# Patient Record
Sex: Female | Born: 1990 | Race: Black or African American | Hispanic: No | Marital: Single | Smoking: Former smoker
Health system: Southern US, Community
[De-identification: ages and names within clinical notes are randomized; demographics above are authoritative.]

## PROBLEM LIST (undated history)

## (undated) DIAGNOSIS — D219 Benign neoplasm of connective and other soft tissue, unspecified: Secondary | ICD-10-CM

## (undated) DIAGNOSIS — J45909 Unspecified asthma, uncomplicated: Secondary | ICD-10-CM

## (undated) DIAGNOSIS — F329 Major depressive disorder, single episode, unspecified: Secondary | ICD-10-CM

## (undated) DIAGNOSIS — F419 Anxiety disorder, unspecified: Secondary | ICD-10-CM

## (undated) DIAGNOSIS — F32A Depression, unspecified: Secondary | ICD-10-CM

## (undated) DIAGNOSIS — D649 Anemia, unspecified: Secondary | ICD-10-CM

## (undated) DIAGNOSIS — O24419 Gestational diabetes mellitus in pregnancy, unspecified control: Secondary | ICD-10-CM

## (undated) HISTORY — PX: CARPAL TUNNEL RELEASE: SHX101

---
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
## 2014-08-16 ENCOUNTER — Emergency Department (HOSPITAL_COMMUNITY): Payer: Self-pay

## 2014-08-16 ENCOUNTER — Emergency Department (HOSPITAL_COMMUNITY)
Admission: EM | Admit: 2014-08-16 | Discharge: 2014-08-16 | Disposition: A | Discharge status: Home / Self Care | Payer: Self-pay | Attending: Emergency Medicine | Admitting: Emergency Medicine

## 2014-08-16 ENCOUNTER — Encounter (HOSPITAL_COMMUNITY): Payer: Self-pay | Admitting: Emergency Medicine

## 2014-08-16 DIAGNOSIS — R000 Tachycardia, unspecified: Secondary | ICD-10-CM | POA: Insufficient documentation

## 2014-08-16 DIAGNOSIS — Z79899 Other long term (current) drug therapy: Secondary | ICD-10-CM | POA: Insufficient documentation

## 2014-08-16 DIAGNOSIS — J45909 Unspecified asthma, uncomplicated: Secondary | ICD-10-CM | POA: Insufficient documentation

## 2014-08-16 DIAGNOSIS — S46909A Unspecified injury of unspecified muscle, fascia and tendon at shoulder and upper arm level, unspecified arm, initial encounter: Secondary | ICD-10-CM | POA: Insufficient documentation

## 2014-08-16 DIAGNOSIS — S4980XA Other specified injuries of shoulder and upper arm, unspecified arm, initial encounter: Secondary | ICD-10-CM | POA: Insufficient documentation

## 2014-08-16 DIAGNOSIS — S4991XA Unspecified injury of right shoulder and upper arm, initial encounter: Secondary | ICD-10-CM

## 2014-08-16 DIAGNOSIS — S299XXA Unspecified injury of thorax, initial encounter: Secondary | ICD-10-CM

## 2014-08-16 DIAGNOSIS — S298XXA Other specified injuries of thorax, initial encounter: Secondary | ICD-10-CM | POA: Insufficient documentation

## 2014-08-16 HISTORY — DX: Unspecified asthma, uncomplicated: J45.909

## 2014-08-16 MED ORDER — HYDROCODONE-ACETAMINOPHEN 5-325 MG PO TABS: 1.0000 | ORAL_TABLET | Freq: Four times a day (QID) | ORAL | Status: DC | PRN

## 2014-08-16 MED ORDER — MORPHINE SULFATE 4 MG/ML IJ SOLN
4.0000 mg | Freq: Once | INTRAMUSCULAR | Status: AC
  Administered 2014-08-16: 4 mg via INTRAVENOUS
  Filled 2014-08-16: qty 1
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
## 2014-12-12 ENCOUNTER — Encounter (HOSPITAL_COMMUNITY): Payer: Self-pay | Admitting: *Deleted

## 2014-12-12 ENCOUNTER — Emergency Department (HOSPITAL_COMMUNITY)
Admission: EM | Admit: 2014-12-12 | Discharge: 2014-12-12 | Discharge status: Against Medical Advice | Payer: Medicaid Other | Attending: Emergency Medicine | Admitting: Emergency Medicine

## 2014-12-12 DIAGNOSIS — J45909 Unspecified asthma, uncomplicated: Secondary | ICD-10-CM | POA: Insufficient documentation

## 2014-12-12 DIAGNOSIS — Z720 Tobacco use: Secondary | ICD-10-CM | POA: Diagnosis not present

## 2014-12-12 DIAGNOSIS — R55 Syncope and collapse: Secondary | ICD-10-CM | POA: Insufficient documentation

## 2014-12-12 LAB — BASIC METABOLIC PANEL
ANION GAP: 8 (ref 5–15)
BUN: 12 mg/dL (ref 6–23)
CHLORIDE: 109 mmol/L (ref 96–112)
CO2: 20 mmol/L (ref 19–32)
CREATININE: 0.85 mg/dL (ref 0.50–1.10)
Calcium: 8.6 mg/dL (ref 8.4–10.5)
GFR calc Af Amer: >90 mL/min (ref >90)
GFR calc non Af Amer: >90 mL/min (ref >90)
GLUCOSE: 96 mg/dL (ref 70–99)
Potassium: 4 mmol/L (ref 3.5–5.1)
SODIUM: 137 mmol/L (ref 135–145)

## 2014-12-12 LAB — CBC
HEMATOCRIT: 42 % (ref 36.0–46.0)
Hemoglobin: 13.5 g/dL (ref 12.0–15.0)
MCH: 26.3 pg (ref 26.0–34.0)
MCHC: 32.1 g/dL (ref 30.0–36.0)
MCV: 81.9 fL (ref 78.0–100.0)
Platelets: 141 10*3/uL — ABNORMAL LOW (ref 150–400)
RBC: 5.13 MIL/uL — ABNORMAL HIGH (ref 3.87–5.11)
RDW: 13.4 % (ref 11.5–15.5)
WBC: 6 10*3/uL (ref 4.0–10.5)

## 2014-12-12 NOTE — ED Notes (Signed)
Pt refusing to sign AMA form. Pt ambulatory from ED with swift and steady gait. Mother requesting directors name and number as well as writers name. Information provided.

## 2014-12-12 NOTE — ED Notes (Signed)
Attempted to take pt to room, per mother pt no longer wants to be seen. Requesting to leave AMA

## 2014-12-12 NOTE — ED Notes (Signed)
Bed: [facility] Expected date:  Expected time:  Means of arrival:  Comments: EMS near syncope after giving platelets this am

## 2014-12-12 NOTE — ED Notes (Signed)
Pt reports giving plasma earlier today around 1600.  Mom reports syncope about 45 minutes ago.  Reports pt was out for about 2 minutes.  Reports pt was sitting down on the computer -suddenly became hot.  So she went out side to cool off, and when she went back in, she had a syncopal episode.  Pt does not recall feeling dizzy prior to syncopal episode.  Pt reports dizziness at present.  Denies any cp or SOB at this time.

## 2014-12-12 NOTE — ED Notes (Addendum)
Pt transported from home after near syncopal episode witnessed by father. Pt was on the ground when near syncope occurred, she was hyperventilating per EMS. Pt did donate plasma this morning. Pt states she has had this happen in the past after donating plasma. A & O. Pt was 70/40 initial contact. IV est, NS 250 bolus given. Per Mother in room pt does not want to be assessed by RN, PA or NP, only wants to see real doctor.

## 2015-01-03 ENCOUNTER — Encounter (HOSPITAL_COMMUNITY): Payer: Self-pay | Admitting: *Deleted

## 2015-01-03 ENCOUNTER — Ambulatory Visit (HOSPITAL_COMMUNITY)
Admission: RE | Admit: 2015-01-03 | Discharge: 2015-01-03 | Disposition: A | Discharge status: Home / Self Care | Payer: Medicaid Other | Attending: Psychiatry | Admitting: Psychiatry

## 2015-01-03 DIAGNOSIS — F329 Major depressive disorder, single episode, unspecified: Secondary | ICD-10-CM | POA: Diagnosis present

## 2015-01-03 DIAGNOSIS — F419 Anxiety disorder, unspecified: Secondary | ICD-10-CM | POA: Diagnosis not present
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
## 2015-09-04 ENCOUNTER — Emergency Department (HOSPITAL_COMMUNITY): Payer: Medicaid Other

## 2015-09-04 ENCOUNTER — Encounter (HOSPITAL_COMMUNITY): Payer: Self-pay | Admitting: Emergency Medicine

## 2015-09-04 ENCOUNTER — Emergency Department (HOSPITAL_COMMUNITY)
Admission: EM | Admit: 2015-09-04 | Discharge: 2015-09-04 | Disposition: A | Discharge status: Home / Self Care | Payer: Medicaid Other | Attending: Emergency Medicine | Admitting: Emergency Medicine

## 2015-09-04 DIAGNOSIS — J159 Unspecified bacterial pneumonia: Secondary | ICD-10-CM | POA: Insufficient documentation

## 2015-09-04 DIAGNOSIS — R05 Cough: Secondary | ICD-10-CM | POA: Diagnosis present

## 2015-09-04 DIAGNOSIS — J189 Pneumonia, unspecified organism: Secondary | ICD-10-CM

## 2015-09-04 DIAGNOSIS — Z3202 Encounter for pregnancy test, result negative: Secondary | ICD-10-CM | POA: Insufficient documentation

## 2015-09-04 DIAGNOSIS — J45909 Unspecified asthma, uncomplicated: Secondary | ICD-10-CM | POA: Insufficient documentation

## 2015-09-04 DIAGNOSIS — Z79899 Other long term (current) drug therapy: Secondary | ICD-10-CM | POA: Diagnosis not present

## 2015-09-04 DIAGNOSIS — R079 Chest pain, unspecified: Secondary | ICD-10-CM

## 2015-09-04 DIAGNOSIS — Z720 Tobacco use: Secondary | ICD-10-CM | POA: Diagnosis not present

## 2015-09-04 LAB — CBC WITH DIFFERENTIAL/PLATELET
Basophils Absolute: 0 10*3/uL (ref 0.0–0.1)
Basophils Relative: 0 %
EOS PCT: 5 %
Eosinophils Absolute: 0.3 10*3/uL (ref 0.0–0.7)
HCT: 38.9 % (ref 36.0–46.0)
Hemoglobin: 12.6 g/dL (ref 12.0–15.0)
LYMPHS ABS: 1.7 10*3/uL (ref 0.7–4.0)
LYMPHS PCT: 31 %
MCH: 26.4 pg (ref 26.0–34.0)
MCHC: 32.4 g/dL (ref 30.0–36.0)
MCV: 81.4 fL (ref 78.0–100.0)
MONO ABS: 0.8 10*3/uL (ref 0.1–1.0)
Monocytes Relative: 15 %
Neutro Abs: 2.6 10*3/uL (ref 1.7–7.7)
Neutrophils Relative %: 49 %
PLATELETS: 157 10*3/uL (ref 150–400)
RBC: 4.78 MIL/uL (ref 3.87–5.11)
RDW: 13.3 % (ref 11.5–15.5)
WBC: 5.4 10*3/uL (ref 4.0–10.5)

## 2015-09-04 LAB — BASIC METABOLIC PANEL
Anion gap: 5 (ref 5–15)
BUN: 6 mg/dL (ref 6–20)
CHLORIDE: 108 mmol/L (ref 101–111)
CO2: 23 mmol/L (ref 22–32)
Calcium: 8.5 mg/dL — ABNORMAL LOW (ref 8.9–10.3)
Creatinine, Ser: 0.71 mg/dL (ref 0.44–1.00)
GFR calc Af Amer: >60 mL/min (ref >60)
GFR calc non Af Amer: >60 mL/min (ref >60)
GLUCOSE: 101 mg/dL — AB (ref 65–99)
Potassium: 3.8 mmol/L (ref 3.5–5.1)
Sodium: 136 mmol/L (ref 135–145)

## 2015-09-04 LAB — TROPONIN I

## 2015-09-04 LAB — POC URINE PREG, ED: Preg Test, Ur: NEGATIVE

## 2015-09-04 MED ORDER — AZITHROMYCIN 250 MG PO TABS
500.0000 mg | ORAL_TABLET | Freq: Once | ORAL | Status: AC
Start: 2015-09-04 — End: 2015-09-04
  Administered 2015-09-04: 500 mg via ORAL
  Filled 2015-09-04: qty 2

## 2015-09-04 MED ORDER — AZITHROMYCIN 250 MG PO TABS
250.0000 mg | ORAL_TABLET | Freq: Every day | ORAL | Status: DC
Start: 2015-09-04 — End: 2017-03-27

## 2015-09-04 NOTE — ED Provider Notes (Signed)
CSN: [id number]     Arrival date & time 09/04/15  7829 History   First MD Initiated Contact with Patient 09/04/15 [phone]     Chief Complaint  Patient presents with  . Cough     (Consider location/radiation/quality/duration/timing/severity/associated sxs/prior Treatment) HPI Comments: 24 year old female with history of asthma who presents with cough and chest pain. Patient reports 2 weeks of dry cough. 2 days ago, she began having a constant chest tightness/pressure which is worse with coughing and has been associated with cold sweats. She endorses mild associated shortness of breath particularly when she is having a coughing episode. She denies any nausea, vomiting, diarrhea, or abdominal pain. No fevers. No urinary symptoms. She does endorse a mild sore throat. Her mother was ill recently with a flulike illness. No family history of early heart disease. No personal or family history of blood clots. No recent travel, leg swelling, or OCP use.  Patient is a 24 y.o. female presenting with cough. The history is provided by the patient.  Cough   Past Medical History  Diagnosis Date  . Asthma    Past Surgical History  Procedure Laterality Date  . Cesarean section     No family history on file. Social History  Substance Use Topics  . Smoking status: Current Every Day Smoker -- 0.25 packs/day    Types: Cigarettes  . Smokeless tobacco: Never Used  . Alcohol Use: Yes     Comment: socially    OB History    No data available     Review of Systems  Respiratory: Positive for cough.    10 Systems reviewed and are negative for acute change except as noted in the HPI.    Allergies  Review of patient's allergies indicates no known allergies.  Home Medications   Prior to Admission medications   Medication Sig Start Date End Date Taking? Authorizing Provider  albuterol (PROVENTIL HFA;VENTOLIN HFA) 108 (90 BASE) MCG/ACT inhaler Inhale 2 puffs into the lungs every 6 (six) hours as needed  for wheezing or shortness of breath.   Yes Historical Provider, MD  cetirizine HCl (ZYRTEC) 5 MG/5ML SYRP Take 10 mg by mouth daily as needed for allergies.   Yes Historical Provider, MD  azithromycin (ZITHROMAX Z-PAK) 250 MG tablet Take 1 tablet (250 mg total) by mouth daily. 09/04/15   Wenda Overland Tremayne Sheldon, MD   BP 107/75 mmHg  Pulse 97  Temp(Src) 98.1 F (36.7 C) (Oral)  Resp 18  SpO2 99%  LMP 08/14/2015 Physical Exam  Constitutional: She is oriented to person, place, and time. She appears well-developed and well-nourished. No distress.  HENT:  Head: Normocephalic and atraumatic.  Mouth/Throat: Oropharynx is clear and moist.  Moist mucous membranes  Eyes: Conjunctivae are normal. Pupils are equal, round, and reactive to light.  Neck: Neck supple.  Cardiovascular: Normal rate, regular rhythm and normal heart sounds.   No murmur heard. Pulmonary/Chest: Effort normal and breath sounds normal. No respiratory distress. She has no wheezes. She exhibits no tenderness.  Occasional cough  Abdominal: Soft. Bowel sounds are normal. She exhibits no distension. There is no tenderness.  Musculoskeletal: She exhibits no edema.  Neurological: She is alert and oriented to person, place, and time.  Fluent speech  Skin: Skin is warm and dry.  Psychiatric: She has a normal mood and affect. Judgment normal.  Nursing note and vitals reviewed.   ED Course  Procedures (including critical care time) Labs Review Labs Reviewed  BASIC METABOLIC PANEL - Abnormal; Notable for  the following:    Glucose, Bld 101 (*)    Calcium 8.5 (*)    All other components within normal limits  CBC WITH DIFFERENTIAL/PLATELET  TROPONIN I  I-STAT TROPOININ, ED  POC URINE PREG, ED    Imaging Review Dg Chest 2 View  09/04/2015  CLINICAL DATA:  Dry cough for 1 week EXAM: CHEST  2 VIEW COMPARISON:  08/16/2014 FINDINGS: Right middle lobe and subtle left upper lung opacities. No edema, effusion, or air leak. Normal  heart size and aortic contours. IMPRESSION: Right middle lobe pneumonia assuming appropriate clinical setting. There may also be milder left lung involvement. Electronically Signed   By: Monte Fantasia M.D.   On: 09/04/2015 09:11   I have personally reviewed and evaluated these images and lab results as part of my medical decision-making.   EKG Interpretation   Date/Time:  Tuesday September 04 2015 08:30:57 EDT Ventricular Rate:  98 PR Interval:  131 QRS Duration: 95 QT Interval:  350 QTC Calculation: 447 R Axis:   90 Text Interpretation:  Sinus rhythm Right atrial enlargement Borderline  right axis deviation Nonspecific T abnormalities, anterior leads T wave  inversions in III, V3 new from previous EKG, no reciprocal changes  Confirmed by Luzelena Heeg MD, Ernesteen Mihalic [phone]) on 09/04/2015 10:29:23 AM      Medications  azithromycin (ZITHROMAX) tablet 500 mg (500 mg Oral Given 09/04/15 1056)   Filed Vitals:   09/04/15 0831 09/04/15 1209 09/04/15 1330  BP: 112/75 109/70 107/75  Pulse: 91 82 97  Temp: 98.2 F (36.8 C) 98.1 F (36.7 C)   TempSrc: Oral Oral   Resp: 19 18   SpO2: 98% 98% 99%    MDM   Final diagnoses:  Community acquired pneumonia  Chest pain, unspecified chest pain type   23yo F w/ asthma who p/w 2 weeks of cough and 2 days of chest pain/tightness and mild SOB w/ coughing. Patient nontoxic in appearance with no respiratory distress. O2 sat 98% on room air and normal work of breathing. No wheezing noted on exam. Obtained basic labs which were unremarkable. Chest x-ray showed a right middle lobe and possible left upper lobe pneumonia. Gave the patient 500 mg azithromycin. Troponin was negative and patient is PERC negative thus PE very unlikely. I suspect that chest pain likely related to coughing and underlying community-acquired pneumonia. On reexamination, the patient is well-appearing. She remains comfortable with normal work of breathing and normal O2 saturations. She has no  wheezing on exam and therefore I do not feel that she needs steroids at this time. I have discussed outpatient treatment of pneumonia and reviewed return precautions including worsening symptoms. Patient voiced understanding and was discharged in satisfactory condition.  Sharlett Iles, MD 09/04/15 [phone]

## 2015-09-04 NOTE — Progress Notes (Signed)
Peak flow 200cc times 2.

## 2015-09-04 NOTE — ED Notes (Signed)
Per pt, states cough for 2 weeks-cold sweats and chest pain for 2 days

## 2015-09-04 NOTE — ED Notes (Signed)
resp called to measure peak flow

## 2015-09-04 NOTE — ED Notes (Signed)
Bed: WA20 Expected date:  Expected time:  Means of arrival:  Comments: 

## 2017-03-27 ENCOUNTER — Emergency Department (HOSPITAL_COMMUNITY)
Admission: EM | Admit: 2017-03-27 | Discharge: 2017-03-28 | Disposition: A | Discharge status: Home / Self Care | Payer: Medicaid Other | Attending: Emergency Medicine | Admitting: Emergency Medicine

## 2017-03-27 ENCOUNTER — Encounter (HOSPITAL_COMMUNITY): Payer: Self-pay | Admitting: Emergency Medicine

## 2017-03-27 DIAGNOSIS — F17210 Nicotine dependence, cigarettes, uncomplicated: Secondary | ICD-10-CM | POA: Diagnosis not present

## 2017-03-27 DIAGNOSIS — J45909 Unspecified asthma, uncomplicated: Secondary | ICD-10-CM | POA: Diagnosis not present

## 2017-03-27 DIAGNOSIS — L02411 Cutaneous abscess of right axilla: Secondary | ICD-10-CM

## 2017-03-27 MED ORDER — HYDROCODONE-ACETAMINOPHEN 5-325 MG PO TABS: 1.0000 | ORAL_TABLET | Freq: Four times a day (QID) | ORAL | 0 refills | Status: DC | PRN

## 2017-03-27 MED ORDER — LIDOCAINE-EPINEPHRINE (PF) 2 %-1:200000 IJ SOLN
20.0000 mL | Freq: Once | INTRAMUSCULAR | Status: AC
  Administered 2017-03-27: 20 mL
  Filled 2017-03-27: qty 20

## 2017-03-27 MED ORDER — NAPROXEN 500 MG PO TABS: 500.0000 mg | ORAL_TABLET | Freq: Two times a day (BID) | ORAL | 0 refills | Status: DC | PRN

## 2017-03-27 MED ORDER — HYDROCODONE-ACETAMINOPHEN 5-325 MG PO TABS
1.0000 | ORAL_TABLET | Freq: Once | ORAL | Status: AC
  Administered 2017-03-27: 1 via ORAL
  Filled 2017-03-27: qty 1

## 2017-03-27 MED ORDER — CEPHALEXIN 500 MG PO CAPS: ORAL_CAPSULE | ORAL | 0 refills | Status: DC

## 2017-03-27 NOTE — Discharge Instructions (Signed)
Keep wound clean and dry. Apply warm compresses to affected area throughout the day, about 4 times per day if possible. Take antibiotic until it is finished. Alternate between naprosyn and norco as directed, as needed for pain but do not drive or operate machinery with pain medication use. Followup with [facility]Primary Care doctor in 2 days for wound recheck and then with your regular doctor in 1 week for recheck of symptoms. Monitor area for signs of infection to include, but not limited to: increasing pain, spreading redness, drainage/pus, worsening swelling, or fevers. Return to emergency department for emergent changing or worsening symptoms.

## 2017-03-27 NOTE — ED Notes (Signed)
ED Provider at bedside. 

## 2017-03-27 NOTE — ED Provider Notes (Signed)
[facility] DEPT Provider Note   CSN: [id number] Arrival date & time: 03/27/17  1803  By signing my name below, I, Hansel Feinstein, attest that this documentation has been prepared under the direction and in the presence of  [address] [employer]. Electronically Signed: Hansel Feinstein, ED Scribe. 03/27/17. 11:03 PM.    History   Chief Complaint Chief Complaint  Patient presents with  . Abscess    HPI Danielle Miller is a 26 y.o. female who presents to the Emergency Department complaining of a gradually worsening, painful and warm swollen "boil" to her R axilla that began 2 days ago. Pt describes her pain as "12/10" constant, sharp shooting, R axilla pain radiating to the R lateral chest wall and breast, worsened with movement and unrelieved with Motrin. She has not had the area I&D'ed contrary to what is stated in the nursing note. No known recent injury or skin opening to the area. Pt states she has frequent h/o similar "boils" but they usually don't get this big, and they usually go away on their own; she has since stopped shaving her armpits. Reports low grade temp 100.0 yesterday. Denies redness/red streaking to or drainage from the area, fevers above 100.4, chills, CP, SOB, abd pain, N/V/D/C, hematuria, dysuria, myalgias, arthralgias, numbness, tingling, focal weakness, or any other complaints at this time. No hx of immunosuppressing conditions.   The history is provided by the patient and medical records. No language interpreter was used.  Abscess  Location:  Shoulder/arm Shoulder/arm abscess location:  R axilla Abscess quality: painful and warmth   Abscess quality: not draining and no redness   Red streaking: no   Duration:  2 days Progression:  Worsening Pain details:    Quality:  Sharp and shooting   Severity:  Severe   Duration:  2 days   Timing:  Constant   Progression:  Worsening Chronicity:  Recurrent Context: not diabetes, not immunosuppression, not insect bite/sting and  not skin injury   Relieved by:  Nothing Exacerbated by: movement. Ineffective treatments:  NSAIDs Associated symptoms: fever   Associated symptoms: no nausea and no vomiting   Risk factors: prior abscess     Past Medical History:  Diagnosis Date  . Asthma     There are no active problems to display for this patient.   Past Surgical History:  Procedure Laterality Date  . CESAREAN SECTION      OB History    No data available       Home Medications    Prior to Admission medications   Medication Sig Start Date End Date Taking? Authorizing Provider  ibuprofen (ADVIL,MOTRIN) 200 MG tablet Take 400 mg by mouth every 6 (six) hours as needed for mild pain.   Yes [provider]    Family History History reviewed. No pertinent family history.  Social History Social History  Substance Use Topics  . Smoking status: Current Every Day Smoker    Packs/day: 0.25    Types: Cigarettes  . Smokeless tobacco: Never Used  . Alcohol use Yes     Comment: socially      Allergies   Patient has no known allergies.   Review of Systems Review of Systems  Constitutional: Positive for fever. Negative for chills.  Respiratory: Negative for shortness of breath.   Cardiovascular: Negative for chest pain.  Gastrointestinal: Negative for abdominal pain, constipation, diarrhea, nausea and vomiting.  Genitourinary: Negative for dysuria and hematuria.  Musculoskeletal: Negative for arthralgias and myalgias.  Skin: Positive for  wound (+painful, warm boil to R axilla ). Negative for color change.  Allergic/Immunologic: Negative for immunocompromised state.  Neurological: Negative for weakness and numbness.  Psychiatric/Behavioral: Negative for confusion.   A complete 10 system review of systems was obtained and all systems are negative except as noted in the HPI and PMH.    Physical Exam Updated Vital Signs BP 111/76 (BP Location: Left Arm)   Pulse 72   Temp 99 F (37.2 C)  (Oral)   Resp 18   Ht 5\' 8"  (1.727 m)   Wt 185 lb 1 oz (83.9 kg)   SpO2 100%   BMI 28.14 kg/m   Physical Exam  Constitutional: She is oriented to person, place, and time. Vital signs are normal. She appears well-developed and well-nourished.  Non-toxic appearance. No distress.  Afebrile, nontoxic, NAD  HENT:  Head: Normocephalic and atraumatic.  Mouth/Throat: Mucous membranes are normal.  Eyes: Conjunctivae and EOM are normal. Right eye exhibits no discharge. Left eye exhibits no discharge.  Neck: Normal range of motion. Neck supple.  Cardiovascular: Normal rate and intact distal pulses.   Pulmonary/Chest: Effort normal. No respiratory distress.  Abdominal: Normal appearance. She exhibits no distension.  Musculoskeletal: Normal range of motion.  Neurological: She is alert and oriented to person, place, and time. She has normal strength. No sensory deficit.  Skin: Skin is warm, dry and intact. Lesion (abscess) noted. No rash noted.  Right axilla with ~4 cm x 3 cm fluctuant abscess, mildly warm with no erythema or red streaking. Moderately tender with no surrounding induration or signs of cellulitis. No drainage. Small opening superior to the abscess that does not seems to connect and has no active drainage.   Psychiatric: She has a normal mood and affect. Her behavior is normal.  Nursing note and vitals reviewed.    ED Treatments / Results   DIAGNOSTIC STUDIES: Oxygen Saturation is 100% on RA, normal by my interpretation.    COORDINATION OF CARE: 10:25 PM Discussed treatment plan with pt at bedside which includes I&D, pain medication, Keflex and pt agreed to plan.    Labs (all labs ordered are listed, but only abnormal results are displayed) Labs Reviewed - No data to display  EKG  EKG Interpretation None       Radiology No results found.  Procedures .Marland KitchenIncision and Drainage Date/Time: 03/27/2017 11:24 PM Performed by: Reece Agar Authorized by: Reece Agar   Consent:    Consent obtained:  Verbal   Consent given by:  Patient   Risks discussed:  Bleeding, incomplete drainage, infection and pain   Alternatives discussed:  No treatment Universal protocol:    Procedure explained and questions answered to patient or proxy's satisfaction: yes     Immediately prior to procedure a time out was called: yes     Patient identity confirmed:  Verbally with patient Location:    Type:  Abscess   Size:  ~ 4 x 3 cm   Location:  Upper extremity   Upper extremity location: R axilla. Pre-procedure details:    Procedure prep: alcohol swab. Anesthesia (see MAR for exact dosages):    Anesthesia method:  Local infiltration   Local anesthetic:  Lidocaine 2% WITH epi Procedure type:    Complexity:  Simple Procedure details:    Needle aspiration: no     Incision types:  Single straight   Incision depth:  Subcutaneous   Scalpel blade:  11   Wound management:  Probed and deloculated   Drainage:  Purulent and bloody   Drainage amount:  Copious   Wound treatment:  Wound left open   Packing materials:  None Post-procedure details:    Patient tolerance of procedure:  Tolerated well, no immediate complications       (including critical care time)  Medications Ordered in ED Medications  HYDROcodone-acetaminophen (NORCO/VICODIN) 5-325 MG per tablet 1 tablet (1 tablet Oral Given 03/27/17 2229)  lidocaine-EPINEPHrine (XYLOCAINE W/EPI) 2 %-1:200000 (PF) injection 20 mL (20 mLs Infiltration Given 03/27/17 2231)     Initial Impression / Assessment and Plan / ED Course  I have reviewed the triage vital signs and the nursing notes.  Pertinent labs & imaging results that were available during my care of the patient were reviewed by me and considered in my medical decision making (see chart for details).     27 y.o. female here with R axilla abscess x2 days. Area fluctuant and tender, no red streaking or surrounding cellulitis. Mild warmth. Will give  pain meds then proceed with I&D.   11:42 PM I&D successful, copious amount of purulent and bloody drainage expressed. Wound left open. Will start on abx, give pain meds. Discussed warm compresses as well. F/up with PCP/UCC in 2 days for wound check, and in 7 days for recheck as well. [place] reviewed prior to dispensing controlled substance medications, and 1 year search was notable for: norco rx 07/14/16 for 30 tabs, no others found. Risks/benefits/alternatives and expectations discussed regarding controlled substances. Side effects of medications discussed. Informed consent obtained.  I explained the diagnosis and have given explicit precautions to return to the ER including for any other new or worsening symptoms. The patient understands and accepts the medical plan as it's been dictated and I have answered their questions. Discharge instructions concerning home care and prescriptions have been given. The patient is STABLE and is discharged to home in good condition.   I personally performed the services described in this documentation, which was scribed in my presence. The recorded information has been reviewed and is accurate.   Final Clinical Impressions(s) / ED Diagnoses   Final diagnoses:  Abscess of axilla, right    New Prescriptions New Prescriptions   CEPHALEXIN (KEFLEX) 500 MG CAPSULE    2 caps po bid x 7 days   HYDROCODONE-ACETAMINOPHEN (NORCO) 5-325 MG TABLET    Take 1 tablet by mouth every 6 (six) hours as needed for severe pain.   NAPROXEN (NAPROSYN) 500 MG TABLET    Take 1 tablet (500 mg total) by mouth 2 (two) times daily as needed for mild pain, moderate pain or headache (TAKE WITH MEALS.).      [address], [place], [place] 03/27/17 2346    Tegeler, Gwenyth Allegra, MD 03/28/17 [phone]

## 2017-03-27 NOTE — ED Notes (Signed)
Pt upset because she waited 3 hours, she refused to let me update her vitals, asked her politely to put the gown on, offered to button it for her she told me to get out.

## 2017-03-27 NOTE — ED Triage Notes (Signed)
Pt reports she has had an abscess in her L axilla for the past 3 days. Had this lanced several days ago, but thinks her skin grew over the packing. Now pain is extending across chest.

## 2017-07-01 ENCOUNTER — Emergency Department (HOSPITAL_COMMUNITY)
Admission: EM | Admit: 2017-07-01 | Discharge: 2017-07-01 | Disposition: A | Discharge status: Home / Self Care | Payer: Medicaid Other | Attending: Emergency Medicine | Admitting: Emergency Medicine

## 2017-07-01 ENCOUNTER — Encounter (HOSPITAL_COMMUNITY): Payer: Self-pay | Admitting: Emergency Medicine

## 2017-07-01 DIAGNOSIS — R05 Cough: Secondary | ICD-10-CM | POA: Diagnosis present

## 2017-07-01 DIAGNOSIS — F17210 Nicotine dependence, cigarettes, uncomplicated: Secondary | ICD-10-CM | POA: Diagnosis not present

## 2017-07-01 DIAGNOSIS — J0110 Acute frontal sinusitis, unspecified: Secondary | ICD-10-CM | POA: Diagnosis not present

## 2017-07-01 DIAGNOSIS — Z79899 Other long term (current) drug therapy: Secondary | ICD-10-CM | POA: Diagnosis not present

## 2017-07-01 MED ORDER — FLUTICASONE PROPIONATE 50 MCG/ACT NA SUSP: 1.0000 | Freq: Every day | NASAL | 0 refills | Status: DC

## 2017-07-01 MED ORDER — GUAIFENESIN 100 MG/5ML PO SOLN: 5.0000 mL | ORAL | 0 refills | Status: DC | PRN

## 2017-07-01 NOTE — Discharge Instructions (Signed)
Please read and follow all provided instructions.  Your diagnoses today include:  1. Acute non-recurrent frontal sinusitis     You appear to have an upper respiratory infection (URI). An upper respiratory tract infection, or cold, is a viral infection of the air passages leading to the lungs. It should improve gradually after 5-7 days. You may have a lingering cough that lasts for 2- 4 weeks after the infection.  Tests performed today include: Vital signs. See below for your results today.   Medications prescribed:   Take any prescribed medications only as directed. Treatment for your infection is aimed at treating the symptoms. There are no medications, such as antibiotics, that will cure your infection.   Home care instructions:  Follow any educational materials contained in this packet.   Your illness is contagious and can be spread to others, especially during the first 3 or 4 days. It cannot be cured by antibiotics or other medicines. Take basic precautions such as washing your hands often, covering your mouth when you cough or sneeze, and avoiding public places where you could spread your illness to others.   Please continue drinking plenty of fluids.  Use over-the-counter medicines as needed as directed on packaging for symptom relief.  You may also use ibuprofen or tylenol as directed on packaging for pain or fever.  Do not take multiple medicines containing Tylenol or acetaminophen to avoid taking too much of this medication.  Follow-up instructions: Please follow-up with your primary care provider in the next 3 days for further evaluation of your symptoms if you are not feeling better.   Return instructions:  Please return to the Emergency Department if you experience worsening symptoms.  RETURN IMMEDIATELY IF you develop shortness of breath, confusion or altered mental status, a new rash, become dizzy, faint, or poorly responsive, or are unable to be cared for at home. Please  return if you have persistent vomiting and cannot keep down fluids or develop a fever that is not controlled by tylenol or motrin.   Please return if you have any other emergent concerns.  Additional Information:  Your vital signs today were: BP 122/82 (BP Location: Left Arm)    Pulse 75    Temp 98 F (36.7 C) (Oral)    Resp 16    Ht 5\' 7"  (1.702 m)    Wt 83.9 kg (185 lb)    LMP 06/25/2017    SpO2 100%    BMI 28.98 kg/m  If your blood pressure (BP) was elevated above 135/85 this visit, please have this repeated by your doctor within one month. --------------

## 2017-07-01 NOTE — ED Triage Notes (Signed)
Pt comes in with complaints of of a dry cough and congestion.  Reports hx of asthma. 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 Endorses having an inhaler at home and used it last night with some relief.

## 2017-07-01 NOTE — ED Provider Notes (Signed)
[facility] DEPT Provider Note   CSN: [id number] Arrival date & time: 07/01/17  1352     History   Chief Complaint No chief complaint on file.   HPI Danielle Miller is a 26 y.o. female.  HPI  26 y.o. female with a hx of Asthma, presents to the Emergency Department today due to cough and congestion. Notes symptoms since Monday. Cough is dry and non productive. No rhinorrhea. No ear aches. No fevers. Attempted Pseudophed with minimal relief. No N/V/D. 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 Notes hx of Asthma and has been using inhaler with moderate relief. No CP/SOB/ABD pain. No pain in general. No other symptoms noted  Past Medical History:  Diagnosis Date  . Asthma     There are no active problems to display for this patient.   Past Surgical History:  Procedure Laterality Date  . CESAREAN SECTION      OB History    No data available       Home Medications    Prior to Admission medications   Medication Sig Start Date End Date Taking? Authorizing Provider  cephALEXin (KEFLEX) 500 MG capsule 2 caps po bid x 7 days 03/27/17   Street, [place], PA-C  HYDROcodone-acetaminophen (NORCO) 5-325 MG tablet Take 1 tablet by mouth every 6 (six) hours as needed for severe pain. 03/27/17   Street, [place], PA-C  ibuprofen (ADVIL,MOTRIN) 200 MG tablet Take 400 mg by mouth every 6 (six) hours as needed for mild pain.    [provider]  naproxen (NAPROSYN) 500 MG tablet Take 1 tablet (500 mg total) by mouth 2 (two) times daily as needed for mild pain, moderate pain or headache (TAKE WITH MEALS.). 03/27/17   Street, [place], PA-C    Family History No family history on file.  Social History Social History  Substance Use Topics  . Smoking status: Current Every Day Smoker    Packs/day: 0.25    Types: Cigarettes  . Smokeless tobacco: Never Used  . Alcohol use Yes     Comment: socially      Allergies   Patient has no  known allergies.   Review of Systems Review of Systems  Constitutional: Negative for fever.  HENT: Positive for congestion. Negative for sinus pain and sore throat.   Respiratory: Positive for cough. Negative for shortness of breath.   Cardiovascular: Negative for chest pain.  Gastrointestinal: Negative for diarrhea and nausea.  Allergic/Immunologic: Negative for immunocompromised state.   Physical Exam Updated Vital Signs BP 122/82 (BP Location: Left Arm)   Pulse 75   Temp 98 F (36.7 C) (Oral)   Resp 16   Ht 5\' 7"  (1.702 m)   Wt 83.9 kg (185 lb)   LMP 06/25/2017   SpO2 100%   BMI 28.98 kg/m   Physical Exam  Constitutional: She is oriented to person, place, and time. She appears well-developed and well-nourished. No distress.  HENT:  Head: Normocephalic and atraumatic.  Right Ear: Hearing, tympanic membrane, external ear and ear canal normal.  Left Ear: Hearing, tympanic membrane, external ear and ear canal normal.  Nose: Right sinus exhibits frontal sinus tenderness. Left sinus exhibits frontal sinus tenderness.  Mouth/Throat: Uvula is midline, oropharynx is clear and moist and mucous membranes are normal. No trismus in the jaw. No oropharyngeal exudate, posterior oropharyngeal erythema or tonsillar abscesses.  Eyes: Pupils are equal, round, and reactive to light. EOM are normal.  Neck: Normal range  of motion. Neck supple. No tracheal deviation present.  Cardiovascular: Normal rate, regular rhythm, S1 normal, S2 normal, normal heart sounds, intact distal pulses and normal pulses.   Pulmonary/Chest: Effort normal and breath sounds normal. No respiratory distress. She has no decreased breath sounds. She has no wheezes. She has no rhonchi. She has no rales.  Abdominal: Normal appearance and bowel sounds are normal. There is no tenderness.  Musculoskeletal: Normal range of motion.  Neurological: She is alert and oriented to person, place, and time.  Skin: Skin is warm and dry.    Psychiatric: She has a normal mood and affect. Her speech is normal and behavior is normal. Thought content normal.   ED Treatments / Results  Labs (all labs ordered are listed, but only abnormal results are displayed) Labs Reviewed - No data to display  EKG  EKG Interpretation None       Radiology No results found.  Procedures Procedures (including critical care time)  Medications Ordered in ED Medications - No data to display   Initial Impression / Assessment and Plan / ED Course  I have reviewed the triage vital signs and the nursing notes.  Pertinent labs & imaging results that were available during my care of the patient were reviewed by me and considered in my medical decision making (see chart for details).  Final Clinical Impressions(s) / ED Diagnoses   {I have reviewed and evaluated the relevant imaging studies.  {I have reviewed the relevant previous healthcare records.  {I obtained HPI from historian.   ED Course:  Assessment: Pt is a 26 y.o. female with a hx of Asthma, presents to the Emergency Department today due to cough and congestion. Notes symptoms since Monday. Cough is dry and non productive. No rhinorrhea. No ear aches. No fevers. Attempted Pseudophed with minimal relief. No N/V/D. 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 Notes hx of Asthma and has been using inhaler with moderate relief. No CP/SOB/ABD pain. No pain in general. On exam, pt in NAD. Nontoxic/nonseptic appearing. VSS. Afebrile. Lungs CTA. Heart RRR. Likely viral sinusitis. Given Rx Flonase. Plan is to DC home with follow up to PCP. At time of discharge, Patient is in no acute distress. Vital Signs are stable. Patient is able to ambulate. Patient able to tolerate PO.   Disposition/Plan:  DC Home Additional Verbal discharge instructions given and discussed with patient.  Pt Instructed to f/u with PCP in the next week for evaluation and treatment of  symptoms. Return precautions given Pt acknowledges and agrees with plan  Supervising Physician Daleen Bo, MD  Final diagnoses:  Acute non-recurrent frontal sinusitis    New Prescriptions New Prescriptions   No medications on file     Shary Decamp, Hershal Coria 07/01/17 1438    Daleen Bo, MD 07/03/17 1037

## 2018-01-14 ENCOUNTER — Emergency Department (HOSPITAL_COMMUNITY)
Admission: EM | Admit: 2018-01-14 | Discharge: 2018-01-14 | Disposition: A | Discharge status: Home / Self Care | Payer: Medicaid Other | Attending: Emergency Medicine | Admitting: Emergency Medicine

## 2018-01-14 ENCOUNTER — Encounter (HOSPITAL_COMMUNITY): Payer: Self-pay | Admitting: *Deleted

## 2018-01-14 ENCOUNTER — Emergency Department (HOSPITAL_COMMUNITY): Payer: Medicaid Other

## 2018-01-14 DIAGNOSIS — R079 Chest pain, unspecified: Secondary | ICD-10-CM

## 2018-01-14 DIAGNOSIS — R0789 Other chest pain: Secondary | ICD-10-CM | POA: Insufficient documentation

## 2018-01-14 DIAGNOSIS — F17210 Nicotine dependence, cigarettes, uncomplicated: Secondary | ICD-10-CM | POA: Diagnosis not present

## 2018-01-14 LAB — BASIC METABOLIC PANEL
ANION GAP: 9 (ref 5–15)
BUN: 8 mg/dL (ref 6–20)
CO2: 25 mmol/L (ref 22–32)
CREATININE: 0.85 mg/dL (ref 0.44–1.00)
Calcium: 8.7 mg/dL — ABNORMAL LOW (ref 8.9–10.3)
Chloride: 104 mmol/L (ref 101–111)
GFR calc Af Amer: >60 mL/min (ref >60)
GLUCOSE: 99 mg/dL (ref 65–99)
Potassium: 3.2 mmol/L — ABNORMAL LOW (ref 3.5–5.1)
Sodium: 138 mmol/L (ref 135–145)

## 2018-01-14 LAB — I-STAT TROPONIN, ED: TROPONIN I, POC: 0 ng/mL (ref 0.00–0.08)

## 2018-01-14 LAB — CBC
HCT: 38.3 % (ref 36.0–46.0)
Hemoglobin: 12.2 g/dL (ref 12.0–15.0)
MCH: 26.7 pg (ref 26.0–34.0)
MCHC: 31.9 g/dL (ref 30.0–36.0)
MCV: 83.8 fL (ref 78.0–100.0)
Platelets: 191 10*3/uL (ref 150–400)
RBC: 4.57 MIL/uL (ref 3.87–5.11)
RDW: 13.4 % (ref 11.5–15.5)
WBC: 3.2 10*3/uL — ABNORMAL LOW (ref 4.0–10.5)

## 2018-01-14 LAB — I-STAT BETA HCG BLOOD, ED (MC, WL, AP ONLY): I-stat hCG, quantitative: <5 m[IU]/mL (ref <5)

## 2018-01-14 NOTE — ED Provider Notes (Signed)
[facility] DEPT Provider Note   CSN: [id number] Arrival date & time: 01/14/18  8756     History   Chief Complaint Chief Complaint  Patient presents with  . Chest Pain    HPI Danielle Miller is a 27 y.o. female.  HPI  Patient with history of asthma presents with complaint of chest pain and pressure.  She states the pain began yesterday.  She denies any cough fever or shortness of breath.  She was concerned she may have pneumonia because she has had this in the past.  She denies any nausea or radiation of pain or diaphoresis.  She describes the pain is centrally located and like a pressure.  She has no leg swelling.  No history of recent travel trauma or surgery.  No history of DVT.  She has not had any treatment prior to arrival.  There are no other associated systemic symptoms, there are no other alleviating or modifying factors.   Past Medical History:  Diagnosis Date  . Asthma     There are no active problems to display for this patient.   Past Surgical History:  Procedure Laterality Date  . CESAREAN SECTION      OB History    No data available       Home Medications    Prior to Admission medications   Medication Sig Start Date End Date Taking? Authorizing Provider  cephALEXin (KEFLEX) 500 MG capsule 2 caps po bid x 7 days Patient not taking: Reported on 01/14/2018 03/27/17   Street, [place], PA-C  fluticasone [facility]) 50 MCG/ACT nasal spray Place 1 spray into both nostrils daily. Patient not taking: Reported on 01/14/2018 07/01/17   Shary Decamp, PA-C  guaiFENesin (ROBITUSSIN) 100 MG/5ML SOLN Take 5 mLs (100 mg total) by mouth every 4 (four) hours as needed for cough or to loosen phlegm. Patient not taking: Reported on 01/14/2018 07/01/17   Shary Decamp, PA-C  HYDROcodone-acetaminophen [facility]) 5-325 MG tablet Take 1 tablet by mouth every 6 (six) hours as needed for severe pain. Patient not taking: Reported on 01/14/2018 03/27/17   Street,  [place], PA-C  naproxen (NAPROSYN) 500 MG tablet Take 1 tablet (500 mg total) by mouth 2 (two) times daily as needed for mild pain, moderate pain or headache (TAKE WITH MEALS.). Patient not taking: Reported on 01/14/2018 03/27/17   Street, [place], PA-C    Family History No family history on file.  Social History Social History   Tobacco Use  . Smoking status: Current Every Day Smoker    Packs/day: 0.25    Types: Cigarettes  . Smokeless tobacco: Never Used  Substance Use Topics  . Alcohol use: Yes    Comment: socially   . Drug use: Yes    Types: Marijuana     Allergies   Patient has no known allergies.   Review of Systems Review of Systems  ROS reviewed and all otherwise negative except for mentioned in HPI   Physical Exam Updated Vital Signs BP 125/90 (BP Location: Right Arm)   Pulse 83   Temp 98.5 F (36.9 C) (Oral)   Resp 18   LMP 01/10/2018   SpO2 99%  Vitals reviewed Physical Exam  Physical Examination: General appearance - alert, well appearing, and in no distress Mental status - alert, oriented to person, place, and time Eyes - no conjunctival injection, no scleral icterus Mouth - mucous membranes moist, pharynx normal without lesions Neck - supple, no significant adenopathy Chest - clear to auscultation,  no wheezes, rales or rhonchi, symmetric air entry Heart - normal rate, regular rhythm, normal S1, S2, no murmurs, rubs, clicks or gallops Abdomen - soft, nontender, nondistended, no masses or organomegaly Neurological - alert, oriented, normal speech Extremities - peripheral pulses normal, no pedal edema, no clubbing or cyanosis Skin - normal coloration and turgor, no rashes   ED Treatments / Results  Labs (all labs ordered are listed, but only abnormal results are displayed) Labs Reviewed  BASIC METABOLIC PANEL - Abnormal; Notable for the following components:      Result Value   Potassium 3.2 (*)    Calcium 8.7 (*)    All other components  within normal limits  CBC - Abnormal; Notable for the following components:   WBC 3.2 (*)    All other components within normal limits  I-STAT TROPONIN, ED  I-STAT BETA HCG BLOOD, ED ([facility], [facility], [facility] ONLY)    EKG  EKG Interpretation  Date/Time:  Thursday January 14 2018 10:39:46 EST Ventricular Rate:  93 PR Interval:    QRS Duration: 94 QT Interval:  364 QTC Calculation: 453 R Axis:   91 Text Interpretation:  Sinus rhythm Biatrial enlargement Borderline right axis deviation Borderline T abnormalities, anterior leads No significant change since last tracing Confirmed by Alfonzo Beers [phone]) on 01/14/2018 3:07:07 PM       Radiology Dg Chest 2 View  Result Date: 01/14/2018 CLINICAL DATA:  Mid chest and back discomfort. Similar symptoms during pneumonia episode 3 months ago. The patient also reports shortness of breath. History of asthma, current smoker. EXAM: CHEST  2 VIEW COMPARISON:  PA and lateral chest x-ray of September 04, 2015 FINDINGS: The lungs are well-expanded. There is no focal infiltrate. There is no pleural effusion or pneumothorax. The heart and pulmonary vascularity are normal. The mediastinum is normal in width. The bony thorax is unremarkable. IMPRESSION: There is no active cardiopulmonary disease. Electronically Signed   By: David  Martinique M.D.   On: 01/14/2018 12:19    Procedures Procedures (including critical care time)  Medications Ordered in ED Medications - No data to display   Initial Impression / Assessment and Plan / ED Course  I have reviewed the triage vital signs and the nursing notes.  Pertinent labs & imaging results that were available during my care of the patient were reviewed by me and considered in my medical decision making (see chart for details).     Patient presented with complaint of chest pressure since yesterday.  She has no wheezing on exam.  Her EKG is reassuring.  Her chest x-ray is reassuring as well.  Labs are not concerning.  She has  no risk factors for DVT and is PERC negative.  Reassurance provided and patient discharged in stable condition.  Discharged with strict return precautions.  Pt agreeable with plan.  Final Clinical Impressions(s) / ED Diagnoses   Final diagnoses:  Nonspecific chest pain    ED Discharge Orders    None       Mabe, Forbes Cellar, MD 01/14/18 [phone]

## 2018-01-14 NOTE — ED Triage Notes (Signed)
Pt complains of chest presure, headache nausea, diarrhea since yesterday.

## 2018-01-14 NOTE — Discharge Instructions (Signed)
Return to the ED with any concerns including difficulty breathing despite using albuterol every 4 hours, not drinking fluids, decreased urine output, vomiting and not able to keep down liquids or medications, decreased level of alertness/lethargy, or any other alarming symptoms °

## 2019-11-06 ENCOUNTER — Other Ambulatory Visit: Payer: Self-pay

## 2019-11-06 ENCOUNTER — Inpatient Hospital Stay (HOSPITAL_COMMUNITY)
Admission: AD | Admit: 2019-11-06 | Discharge: 2019-11-06 | Disposition: A | Discharge status: Home / Self Care | Payer: Medicaid Other | Attending: Obstetrics and Gynecology | Admitting: Obstetrics and Gynecology

## 2019-11-06 ENCOUNTER — Encounter (HOSPITAL_COMMUNITY): Payer: Self-pay | Admitting: Obstetrics and Gynecology

## 2019-11-06 DIAGNOSIS — Z79899 Other long term (current) drug therapy: Secondary | ICD-10-CM | POA: Diagnosis not present

## 2019-11-06 DIAGNOSIS — Z791 Long term (current) use of non-steroidal anti-inflammatories (NSAID): Secondary | ICD-10-CM | POA: Insufficient documentation

## 2019-11-06 DIAGNOSIS — O99511 Diseases of the respiratory system complicating pregnancy, first trimester: Secondary | ICD-10-CM | POA: Diagnosis not present

## 2019-11-06 DIAGNOSIS — J45909 Unspecified asthma, uncomplicated: Secondary | ICD-10-CM | POA: Diagnosis not present

## 2019-11-06 DIAGNOSIS — Z3A13 13 weeks gestation of pregnancy: Secondary | ICD-10-CM | POA: Insufficient documentation

## 2019-11-06 DIAGNOSIS — O26891 Other specified pregnancy related conditions, first trimester: Secondary | ICD-10-CM | POA: Diagnosis not present

## 2019-11-06 DIAGNOSIS — Z87891 Personal history of nicotine dependence: Secondary | ICD-10-CM | POA: Insufficient documentation

## 2019-11-06 DIAGNOSIS — R109 Unspecified abdominal pain: Secondary | ICD-10-CM

## 2019-11-06 LAB — URINALYSIS, ROUTINE W REFLEX MICROSCOPIC
Bilirubin Urine: NEGATIVE
Glucose, UA: NEGATIVE mg/dL
Hgb urine dipstick: NEGATIVE
Ketones, ur: NEGATIVE mg/dL
Leukocytes,Ua: NEGATIVE
Nitrite: NEGATIVE
Protein, ur: NEGATIVE mg/dL
Specific Gravity, Urine: 1.011 (ref 1.005–1.030)
pH: 7 (ref 5.0–8.0)

## 2019-11-06 LAB — WET PREP, GENITAL
Sperm: NONE SEEN
Trich, Wet Prep: NONE SEEN
Yeast Wet Prep HPF POC: NONE SEEN

## 2019-11-06 LAB — POCT PREGNANCY, URINE: Preg Test, Ur: POSITIVE — AB

## 2019-11-06 MED ORDER — ACETAMINOPHEN 500 MG PO TABS
1000.0000 mg | ORAL_TABLET | Freq: Once | ORAL | Status: AC
  Administered 2019-11-06: 1000 mg via ORAL
  Filled 2019-11-06: qty 2

## 2019-11-06 MED ORDER — METRONIDAZOLE 500 MG PO TABS: 500.0000 mg | ORAL_TABLET | Freq: Two times a day (BID) | ORAL | 0 refills | Status: DC

## 2019-11-06 NOTE — MAU Note (Signed)
Pt reports that she started having severe pressure and pain in lower back and abdomen that began yesterday afternoon.  Pt reports that she is being seen by [facility] for prenatal care.  Pt states that her pain is an 8 on 0/10 pain scale.  Pt denies any vaginal bleeding.

## 2019-11-06 NOTE — MAU Provider Note (Signed)
Chief Complaint: Back Pain and Abdominal Pain   First Provider Initiated Contact with Patient 11/06/19 1214      SUBJECTIVE HPI: Danielle Miller is a 28 y.o. G2P1001 at [redacted]w[redacted]d by LMP, confirmed by early ultrasound in the office at [facility] who presents to maternity admissions reporting onset of intermittent lower abdominal cramping and back pain 2 days ago.  The pain is low in the front of her abdomen and bilaterally in lower abdomen/groin, intermittent cramping pain that radiates to her low back.  There are no associated symptoms. She denies any constipation, urinary or vaginal symptoms.  She has not tried any treatments.   HPI  Past Medical History:  Diagnosis Date  . Asthma    Past Surgical History:  Procedure Laterality Date  . CESAREAN SECTION     Social History   Socioeconomic History  . Marital status: Single    Spouse name: Not on file  . Number of children: Not on file  . Years of education: Not on file  . Highest education level: Not on file  Occupational History  . Not on file  Tobacco Use  . Smoking status: Former Smoker    Packs/day: 0.25    Types: Cigarettes  . Smokeless tobacco: Never Used  Substance and Sexual Activity  . Alcohol use: Not Currently    Comment: socially   . Drug use: Not Currently    Types: Marijuana  . Sexual activity: Yes    Birth control/protection: None  Other Topics Concern  . Not on file  Social History Narrative  . Not on file   Social Determinants of Health   Financial Resource Strain:   . Difficulty of Paying Living Expenses: Not on file  Food Insecurity:   . Worried About Charity fundraiser in the Last Year: Not on file  . Ran Out of Food in the Last Year: Not on file  Transportation Needs:   . Lack of Transportation (Medical): Not on file  . Lack of Transportation (Non-Medical): Not on file  Physical Activity:   . Days of Exercise per Week: Not on file  . Minutes of Exercise per Session: Not on file   Stress:   . Feeling of Stress : Not on file  Social Connections:   . Frequency of Communication with Friends and Family: Not on file  . Frequency of Social Gatherings with Friends and Family: Not on file  . Attends Religious Services: Not on file  . Active Member of Clubs or Organizations: Not on file  . Attends Archivist Meetings: Not on file  . Marital Status: Not on file  Intimate Partner Violence:   . Fear of Current or Ex-Partner: Not on file  . Emotionally Abused: Not on file  . Physically Abused: Not on file  . Sexually Abused: Not on file   No current facility-administered medications on file prior to encounter.   No current outpatient medications on file prior to encounter.   Allergies  Allergen Reactions  . Sulfa Antibiotics     ROS:  Review of Systems  Constitutional: Negative for chills, fatigue and fever.  Respiratory: Negative for shortness of breath.   Cardiovascular: Negative for chest pain.  Gastrointestinal: Positive for abdominal pain. Negative for nausea and vomiting.  Genitourinary: Negative for difficulty urinating, dysuria, flank pain, pelvic pain, vaginal bleeding, vaginal discharge and vaginal pain.  Musculoskeletal: Positive for back pain.  Neurological: Negative for dizziness and headaches.  Psychiatric/Behavioral: Negative.  I have reviewed patient's Past Medical Hx, Surgical Hx, Family Hx, Social Hx, medications and allergies.   Physical Exam   Patient Vitals for the past 24 hrs:  BP Temp Temp src Pulse Resp SpO2 Weight  11/06/19 1356 120/78 -- -- 70 16 100 % --  11/06/19 1039 115/72 98.4 F (36.9 C) Oral 81 16 100 % 89.7 kg   Constitutional: Well-developed, well-nourished female in no acute distress.  Cardiovascular: normal rate Respiratory: normal effort GI: Abd soft, non-tender. Pos BS x 4 MS: Extremities nontender, no edema, normal ROM Neurologic: Alert and oriented x 4.  GU: Neg CVAT.  PELVIC EXAM: Wet prep/GC  collected by blind swab  FHT 143 by doppler  LAB RESULTS Results for orders placed or performed during the hospital encounter of 11/06/19 (from the past 24 hour(s))  Pregnancy, urine POC     Status: Abnormal   Collection Time: 11/06/19 10:44 AM  Result Value Ref Range   Preg Test, Ur POSITIVE (A) NEGATIVE  Urinalysis, Routine w reflex microscopic     Status: Abnormal   Collection Time: 11/06/19 11:03 AM  Result Value Ref Range   Color, Urine STRAW (A) YELLOW   APPearance CLEAR CLEAR   Specific Gravity, Urine 1.011 1.005 - 1.030   pH 7.0 5.0 - 8.0   Glucose, UA NEGATIVE NEGATIVE mg/dL   Hgb urine dipstick NEGATIVE NEGATIVE   Bilirubin Urine NEGATIVE NEGATIVE   Ketones, ur NEGATIVE NEGATIVE mg/dL   Protein, ur NEGATIVE NEGATIVE mg/dL   Nitrite NEGATIVE NEGATIVE   Leukocytes,Ua NEGATIVE NEGATIVE  Wet prep, genital     Status: Abnormal   Collection Time: 11/06/19 12:05 PM   Specimen: Cervix; Genital  Result Value Ref Range   Yeast Wet Prep HPF POC NONE SEEN NONE SEEN   Trich, Wet Prep NONE SEEN NONE SEEN   Clue Cells Wet Prep HPF POC PRESENT (A) NONE SEEN   WBC, Wet Prep HPF POC FEW (A) NONE SEEN   Sperm NONE SEEN        IMAGING No results found.  MAU Management/MDM: Orders Placed This Encounter  Procedures  . Wet prep, genital  . OB Urine Culture  . Urinalysis, Routine w reflex microscopic  . Pregnancy, urine POC  . Discharge patient    Meds ordered this encounter  Medications  . acetaminophen (TYLENOL) tablet 1,000 mg  . metroNIDAZOLE (FLAGYL) 500 MG tablet    Sig: Take 1 tablet (500 mg total) by mouth 2 (two) times daily.    Dispense:  14 tablet    Refill:  0    Order Specific Question:   Supervising Provider    Answer:   Aletha Halim [id number]    Pt without acute abdomen.  FHT wnl by doppler today. Cervix closed/thick so no evidence of preterm labor.  Pt did not tolerate pelvic exam well and reports this is her baseline for exams.  Pain c/w round  ligament pain. UA wnl but sent for culture due to pt pain. Clue cells on wet prep, pt with hx BV so discussed with pt and offered treatment with Flagyl.  Rx sent. Rest/ice/heat/warm bath/Tylenol/pregnancy support belt Keep scheduled appts in office, return to MAU as needed for emergencies.    ASSESSMENT 1. Abdominal pain during pregnancy in first trimester     PLAN Discharge home Allergies as of 11/06/2019      Reactions   Sulfa Antibiotics       Medication List    STOP taking these medications  cephALEXin 500 MG capsule Commonly known as: Keflex   fluticasone 50 MCG/ACT nasal spray Commonly known as: FLONASE   guaiFENesin 100 MG/5ML Soln Commonly known as: ROBITUSSIN   HYDROcodone-acetaminophen 5-325 MG tablet Commonly known as: Norco   naproxen 500 MG tablet Commonly known as: NAPROSYN     TAKE these medications   metroNIDAZOLE 500 MG tablet Commonly known as: FLAGYL Take 1 tablet (500 mg total) by mouth 2 (two) times daily.      Follow-up Information    [facility] Follow up.   Why: As scheduled, return to MAU as needed for emergencies. Contact information: [address]  [address] [place] [address] [phone]           Fatima Blank Certified Nurse-Midwife 11/06/2019  7:33 PM

## 2019-11-06 NOTE — MAU Note (Signed)
Pt support person requesting someone evaluate patient, wanting to make sure patient is not having contractions and verbalizes that pt is in a lot of pain. This RN relayed message to [facility] who states she will see pt. This RN apologized to patient and support person for the delay and explained reason for delay.

## 2019-11-07 ENCOUNTER — Other Ambulatory Visit: Payer: Self-pay

## 2019-11-07 ENCOUNTER — Encounter (HOSPITAL_COMMUNITY): Payer: Self-pay | Admitting: Obstetrics and Gynecology

## 2019-11-07 ENCOUNTER — Inpatient Hospital Stay (HOSPITAL_COMMUNITY)
Admission: AD | Admit: 2019-11-07 | Discharge: 2019-11-08 | Disposition: A | Discharge status: Home / Self Care | Payer: Medicaid Other | Attending: Obstetrics and Gynecology | Admitting: Obstetrics and Gynecology

## 2019-11-07 ENCOUNTER — Inpatient Hospital Stay (HOSPITAL_COMMUNITY): Payer: Medicaid Other

## 2019-11-07 ENCOUNTER — Inpatient Hospital Stay (HOSPITAL_BASED_OUTPATIENT_CLINIC_OR_DEPARTMENT_OTHER): Payer: Medicaid Other

## 2019-11-07 DIAGNOSIS — O26892 Other specified pregnancy related conditions, second trimester: Secondary | ICD-10-CM

## 2019-11-07 DIAGNOSIS — D259 Leiomyoma of uterus, unspecified: Secondary | ICD-10-CM | POA: Diagnosis not present

## 2019-11-07 DIAGNOSIS — R109 Unspecified abdominal pain: Secondary | ICD-10-CM | POA: Diagnosis not present

## 2019-11-07 DIAGNOSIS — O3412 Maternal care for benign tumor of corpus uteri, second trimester: Secondary | ICD-10-CM | POA: Diagnosis not present

## 2019-11-07 DIAGNOSIS — O26899 Other specified pregnancy related conditions, unspecified trimester: Secondary | ICD-10-CM

## 2019-11-07 DIAGNOSIS — Z3A14 14 weeks gestation of pregnancy: Secondary | ICD-10-CM

## 2019-11-07 LAB — CBC
HCT: 35.7 % — ABNORMAL LOW (ref 36.0–46.0)
Hemoglobin: 12 g/dL (ref 12.0–15.0)
MCH: 26.6 pg (ref 26.0–34.0)
MCHC: 33.6 g/dL (ref 30.0–36.0)
MCV: 79.2 fL — ABNORMAL LOW (ref 80.0–100.0)
Platelets: 197 10*3/uL (ref 150–400)
RBC: 4.51 MIL/uL (ref 3.87–5.11)
RDW: 14.2 % (ref 11.5–15.5)
WBC: 11.7 10*3/uL — ABNORMAL HIGH (ref 4.0–10.5)
nRBC: 0 % (ref 0.0–0.2)

## 2019-11-07 LAB — GC/CHLAMYDIA PROBE AMP (~~LOC~~) NOT AT ARMC
Chlamydia: NEGATIVE
Comment: NEGATIVE
Comment: NORMAL
Neisseria Gonorrhea: NEGATIVE

## 2019-11-07 MED ORDER — HYDROMORPHONE HCL 1 MG/ML IJ SOLN
1.0000 mg | Freq: Once | INTRAMUSCULAR | Status: AC
  Administered 2019-11-07: 1 mg via INTRAVENOUS
  Filled 2019-11-07: qty 1

## 2019-11-07 MED ORDER — LACTATED RINGERS IV BOLUS
1000.0000 mL | Freq: Once | INTRAVENOUS | Status: AC
  Administered 2019-11-07: 1000 mL via INTRAVENOUS

## 2019-11-07 NOTE — MAU Provider Note (Signed)
History     CSN: [id number]  Arrival date and time: 11/07/19 [id number]   First Provider Initiated Contact with Patient 11/07/19 2105      Chief Complaint  Patient presents with  . Abdominal Pain   28 y.o. G2P1001 @14 .0 wks presenting with abd pain and LBP. Pain started 2 days ago. At the time pain was in lower abdomen and bilateral, today it was upper and lower. Describes as intermittent about q2 min and lasting about 1 min. Rates pain 10/10. She took Tylenol but it didn't help. Denies VB or discharge. Reports 1 episode of emesis after she took a dose of Flagyl today. She was seen in MAU yesterday for similar sx and work-up was negative. Reports hx of constipation but had normal BM yesterday and small BM today.   OB History    Gravida  2   Para  1   Term  1   Preterm      AB      Living  1     SAB      TAB      Ectopic      Multiple      Live Births  1           Past Medical History:  Diagnosis Date  . Asthma     Past Surgical History:  Procedure Laterality Date  . CESAREAN SECTION      No family history on file.  Social History   Tobacco Use  . Smoking status: Former Smoker    Packs/day: 0.25    Types: Cigarettes  . Smokeless tobacco: Never Used  Substance Use Topics  . Alcohol use: Not Currently    Comment: socially   . Drug use: Not Currently    Types: Marijuana    Allergies:  Allergies  Allergen Reactions  . Sulfa Antibiotics     Medications Prior to Admission  Medication Sig Dispense Refill Last Dose  . metroNIDAZOLE (FLAGYL) 500 MG tablet Take 1 tablet (500 mg total) by mouth 2 (two) times daily. 14 tablet 0    Review of Systems  Constitutional: Negative for chills and fever.  Gastrointestinal: Positive for abdominal pain, constipation and vomiting. Negative for diarrhea and nausea.  Genitourinary: Negative for dysuria, frequency, hematuria, urgency, vaginal bleeding and vaginal discharge.  Musculoskeletal: Positive for back pain.    Physical Exam   Blood pressure 130/77, pulse (!) 102, temperature 98.6 F (37 C), resp. rate 20, weight 88.1 kg, last menstrual period 08/01/2019.  Physical Exam  Nursing note and vitals reviewed. Constitutional: She is oriented to person, place, and time. She appears well-developed and well-nourished. No distress.  HENT:  Head: Normocephalic and atraumatic.  Respiratory: Effort normal. No respiratory distress.  GI: Soft. Bowel sounds are normal. She exhibits no distension and no mass. There is abdominal tenderness (L>R). There is guarding. There is no rebound.  Genitourinary:    Genitourinary Comments: VE: closed/long   Musculoskeletal:        General: Normal range of motion.     Cervical back: Normal range of motion.  Neurological: She is alert and oriented to person, place, and time.  Skin: Skin is warm and dry.  Psychiatric: She has a normal mood and affect.  FHT by doppler 156  Results for orders placed or performed during the hospital encounter of 11/07/19 (from the past 24 hour(s))  CBC     Status: Abnormal   Collection Time: 11/07/19  8:46 PM  Result Value Ref  Range   WBC 11.7 (H) 4.0 - 10.5 K/uL   RBC 4.51 3.87 - 5.11 MIL/uL   Hemoglobin 12.0 12.0 - 15.0 g/dL   HCT 35.7 (L) 36.0 - 46.0 %   MCV 79.2 (L) 80.0 - 100.0 fL   MCH 26.6 26.0 - 34.0 pg   MCHC 33.6 30.0 - 36.0 g/dL   RDW 14.2 11.5 - 15.5 %   Platelets 197 150 - 400 K/uL   nRBC 0.0 0.0 - 0.2 %   MRI: call from Dr. Vonzell Schlatter fluid in RLQ, Rt ovary heterogenous, appendix looks normal.   MAU Course  Procedures Meds ordered this encounter  Medications  . lactated ringers bolus 1,000 mL  . HYDROmorphone (DILAUDID) injection 1 mg  . HYDROcodone-acetaminophen (NORCO/VICODIN) 5-325 MG tablet    Sig: Take 1 tablet by mouth every 6 (six) hours as needed for severe pain.    Dispense:  10 tablet    Refill:  0    Order Specific Question:   Supervising Provider    Answer:   Elonda Husky, LUTHER H [[id number]]  . ibuprofen  (ADVIL) 600 MG tablet    Sig: Take 1 tablet (600 mg total) by mouth every 6 (six) hours as needed for mild pain or moderate pain.    Dispense:  8 tablet    Refill:  0    Order Specific Question:   Supervising Provider    Answer:   Florian Buff [[id number]]   MDM Labs and [place] ordered and reviewed. OB US normal except 6cm uterine fibroid. Re-examined pt after Dilaudid, continues to have significant LUQ tenderness and diffuse tenderness in all quadrants with palpation, MRI ordered.  0030: Consult with Dr. Elonda Husky, dicussed labs and imaging, pain likely d/t degenerating fibroid, plan for analgesics and outpt f/u.  0040: Pt reports pain is minimal although still feels some. Discussed results and plan. 0050: Dr. Marvel Plan called MAU provider for update. Discussed presentation, clinical findings and plan. Recommends short course of NSAIDS. Stable for discharge home. Assessment and Plan   1. [redacted] weeks gestation of pregnancy   2. Abdominal pain affecting pregnancy   3. Uterine fibroid in pregnancy    Discharge home Follow up at [facility] next week Rx Ibuprofen Rx Vicodin Return precautions  Allergies as of 11/08/2019      Reactions   Sulfa Antibiotics       Medication List    TAKE these medications   HYDROcodone-acetaminophen 5-325 MG tablet Commonly known as: NORCO/VICODIN Take 1 tablet by mouth every 6 (six) hours as needed for severe pain.   ibuprofen 600 MG tablet Commonly known as: ADVIL Take 1 tablet (600 mg total) by mouth every 6 (six) hours as needed for mild pain or moderate pain.   metroNIDAZOLE 500 MG tablet Commonly known as: FLAGYL Take 1 tablet (500 mg total) by mouth 2 (two) times daily.      Julianne Handler, CNM 11/08/2019, 12:55 AM

## 2019-11-07 NOTE — MAU Note (Signed)
Patient states she is unable to void at this time.  

## 2019-11-07 NOTE — MAU Note (Signed)
Patient states she was seen yesterday for lower abdominal and rectal pain, states the pain is now worse and has traveled down to her vagina.  No VB/discharge.  States the pain is intermittent every 5-10 mins.

## 2019-11-08 MED ORDER — HYDROCODONE-ACETAMINOPHEN 5-325 MG PO TABS: 1.0000 | ORAL_TABLET | Freq: Four times a day (QID) | ORAL | 0 refills | Status: DC | PRN

## 2019-11-08 MED ORDER — IBUPROFEN 600 MG PO TABS: 600.0000 mg | ORAL_TABLET | Freq: Four times a day (QID) | ORAL | 0 refills | Status: DC | PRN

## 2019-11-08 NOTE — Discharge Instructions (Signed)
Abdominal Pain During Pregnancy  Belly (abdominal) pain is common during pregnancy. There are many possible causes. Most of the time, it is not a serious problem. Other times, it can be a sign that something is wrong with the pregnancy. Always tell your doctor if you have belly pain. Follow these instructions at home:  Do not have sex or put anything in your vagina until your pain goes away completely.  Get plenty of rest until your pain gets better.  Drink enough fluid to keep your pee (urine) pale yellow.  Take over-the-counter and prescription medicines only as told by your doctor.  Keep all follow-up visits as told by your doctor. This is important. Contact a doctor if:  Your pain continues or gets worse after resting.  You have lower belly pain that: ? Comes and goes at regular times. ? Spreads to your back. ? Feels like menstrual cramps.  You have pain or burning when you pee (urinate). Get help right away if:  You have a fever or chills.  You have vaginal bleeding.  You are leaking fluid from your vagina.  You are passing tissue from your vagina.  You throw up (vomit) for more than 24 hours.  You have watery poop (diarrhea) for more than 24 hours.  Your baby is moving less than usual.  You feel very weak or faint.  You have shortness of breath.  You have very bad pain in your upper belly. Summary  Belly (abdominal) pain is common during pregnancy. There are many possible causes.  If you have belly pain during pregnancy, tell your doctor right away.  Keep all follow-up visits as told by your doctor. This is important. This information is not intended to replace advice given to you by your health care provider. Make sure you discuss any questions you have with your health care provider. Document Released: 10/22/2009 Document Revised: 02/21/2019 Document Reviewed: 02/05/2017 Elsevier Patient Education  2020 [employer].   Uterine Fibroids  Uterine  fibroids are lumps of tissue (tumors) in your womb (uterus). They are not cancer (are benign). Most women with this condition do not need treatment. Sometimes fibroids can affect your ability to have children (your fertility). If that happens, you may need surgery to take out the fibroids. Follow these instructions at home:  Take over-the-counter and prescription medicines only as told by your doctor. Your doctor may suggest NSAIDs (such as aspirin or ibuprofen) to help with pain.  Ask your doctor if you should: ? Take iron pills. ? Eat more foods that have iron in them, such as dark green, leafy vegetables.  If directed, apply heat to your back or belly to reduce pain. Use the heat source that your doctor recommends, such as a moist heat pack or a heating pad. ? Put a towel between your skin and the heat source. ? Leave the heat on for 20-30 minutes. ? Remove the heat if your skin turns bright red. This is especially important if you are unable to feel pain, heat, or cold. You may have a greater risk of getting burned.  Pay close attention to your period (menstrual) cycles. Tell your doctor about any changes, such as: ? A heavier blood flow than usual. ? Needing to use more pads or tampons than normal. ? A change in how many days your period lasts. ? A change in symptoms that come with your period, such as cramps or back pain.  Keep all follow-up visits as told by your doctor.  This is important. Your doctor may need to watch your fibroids over time for any changes. Contact a doctor if you:  Have pain that does not get better with medicine or heat, such as pain or cramps in: ? Your back. ? The area between your hip bones (pelvic area). ? Your belly.  Have new bleeding between your periods.  Have more bleeding during or between your periods.  Feel very tired or weak.  Feel light-headed. Get help right away if you:  Pass out (faint).  Have pain in the area between your hip bones  that suddenly gets worse.  Have bleeding that soaks a tampon or pad in 30 minutes or less. Summary  Uterine fibroids are lumps of tissue (tumors) in your womb (uterus). They are not cancer.  The only treatment that most women need is taking aspirin or ibuprofen for pain.  Contact a doctor if you have pain or cramps that do not get better with medicine.  Make sure you know what symptoms you should get help for right away. This information is not intended to replace advice given to you by your health care provider. Make sure you discuss any questions you have with your health care provider. Document Released: 12/06/2010 Document Revised: 10/16/2017 Document Reviewed: 09/29/2017 Elsevier Patient Education  2020 [employer].

## 2019-11-09 LAB — CULTURE, OB URINE: Culture: <10000 — AB

## 2020-02-27 ENCOUNTER — Encounter: Payer: Medicaid Other | Attending: Obstetrics and Gynecology | Admitting: Registered"

## 2020-02-27 ENCOUNTER — Encounter: Payer: Self-pay | Admitting: Registered"

## 2020-02-27 ENCOUNTER — Other Ambulatory Visit: Payer: Self-pay

## 2020-02-27 DIAGNOSIS — O24410 Gestational diabetes mellitus in pregnancy, diet controlled: Secondary | ICD-10-CM | POA: Insufficient documentation

## 2020-02-27 NOTE — Progress Notes (Signed)
Patient was seen on 02/27/2020 for Gestational Diabetes self-management education at [facility]. The following learning objectives were met by the patient during this course:   States the definition of Gestational Diabetes  States why dietary management is important in controlling blood glucose  Describes the effects each nutrient has on blood glucose levels  Demonstrates ability to create a balanced meal plan  Demonstrates carbohydrate counting (RD did not do carb counting, her blood sugar is under control with eating generally balanced meals)  States when to check blood glucose levels  Demonstrates proper blood glucose monitoring techniques  States the effect of stress and exercise on blood glucose levels  States the importance of limiting caffeine and abstaining from alcohol and smoking  Blood glucose monitor given: none Patient is checking and reports has been checking since March 26th and only out of range 2 times.   Patient instructed to monitor glucose levels: FBS: 60 - <95; 1 hour: <140; 2 hour: <120  Patient received handouts:  Nutrition Diabetes and Pregnancy, including carb counting list  Patient will be seen for follow-up as needed.

## 2020-03-02 DIAGNOSIS — O24410 Gestational diabetes mellitus in pregnancy, diet controlled: Secondary | ICD-10-CM | POA: Insufficient documentation

## 2020-03-20 ENCOUNTER — Encounter (HOSPITAL_COMMUNITY): Payer: Self-pay | Admitting: Obstetrics and Gynecology

## 2020-03-20 ENCOUNTER — Inpatient Hospital Stay (HOSPITAL_COMMUNITY)
Admission: AD | Admit: 2020-03-20 | Discharge: 2020-03-20 | Disposition: A | Discharge status: Home / Self Care | Payer: Medicaid Other | Attending: Obstetrics and Gynecology | Admitting: Obstetrics and Gynecology

## 2020-03-20 ENCOUNTER — Other Ambulatory Visit: Payer: Self-pay

## 2020-03-20 ENCOUNTER — Inpatient Hospital Stay (HOSPITAL_BASED_OUTPATIENT_CLINIC_OR_DEPARTMENT_OTHER): Payer: Medicaid Other

## 2020-03-20 DIAGNOSIS — O288 Other abnormal findings on antenatal screening of mother: Secondary | ICD-10-CM

## 2020-03-20 DIAGNOSIS — O34219 Maternal care for unspecified type scar from previous cesarean delivery: Secondary | ICD-10-CM | POA: Diagnosis not present

## 2020-03-20 DIAGNOSIS — O24415 Gestational diabetes mellitus in pregnancy, controlled by oral hypoglycemic drugs: Secondary | ICD-10-CM | POA: Insufficient documentation

## 2020-03-20 DIAGNOSIS — O289 Unspecified abnormal findings on antenatal screening of mother: Secondary | ICD-10-CM | POA: Diagnosis not present

## 2020-03-20 DIAGNOSIS — Z87891 Personal history of nicotine dependence: Secondary | ICD-10-CM | POA: Insufficient documentation

## 2020-03-20 DIAGNOSIS — Z3A33 33 weeks gestation of pregnancy: Secondary | ICD-10-CM

## 2020-03-20 DIAGNOSIS — Z882 Allergy status to sulfonamides status: Secondary | ICD-10-CM | POA: Diagnosis not present

## 2020-03-20 HISTORY — DX: Anxiety disorder, unspecified: F41.9

## 2020-03-20 HISTORY — DX: Gestational diabetes mellitus in pregnancy, unspecified control: O24.419

## 2020-03-20 HISTORY — DX: Anemia, unspecified: D64.9

## 2020-03-20 HISTORY — DX: Benign neoplasm of connective and other soft tissue, unspecified: D21.9

## 2020-03-20 HISTORY — DX: Depression, unspecified: F32.A

## 2020-03-20 NOTE — Discharge Instructions (Signed)
Fetal Movement Counts Patient Name: ________________________________________________ Patient Due Date: ____________________ What is a fetal movement count?  A fetal movement count is the number of times that you feel your baby move during a certain amount of time. This may also be called a fetal kick count. A fetal movement count is recommended for every pregnant woman. You may be asked to start counting fetal movements as early as week 28 of your pregnancy. Pay attention to when your baby is most active. You may notice your baby's sleep and wake cycles. You may also notice things that make your baby move more. You should do a fetal movement count:  When your baby is normally most active.  At the same time each day. A good time to count movements is while you are resting, after having something to eat and drink. How do I count fetal movements? 1. Find a quiet, comfortable area. Sit, or lie down on your side. 2. Write down the date, the start time and stop time, and the number of movements that you felt between those two times. Take this information with you to your health care visits. 3. Write down your start time when you feel the first movement. 4. Count kicks, flutters, swishes, rolls, and jabs. You should feel at least 10 movements. 5. You may stop counting after you have felt 10 movements, or if you have been counting for 2 hours. Write down the stop time. 6. If you do not feel 10 movements in 2 hours, contact your health care provider for further instructions. Your health care provider may want to do additional tests to assess your baby's well-being. Contact a health care provider if:  You feel fewer than 10 movements in 2 hours.  Your baby is not moving like he or she usually does. Date: ____________ Start time: ____________ Stop time: ____________ Movements: ____________ Date: ____________ Start time: ____________ Stop time: ____________ Movements: ____________ Date: ____________  Start time: ____________ Stop time: ____________ Movements: ____________ Date: ____________ Start time: ____________ Stop time: ____________ Movements: ____________ Date: ____________ Start time: ____________ Stop time: ____________ Movements: ____________ Date: ____________ Start time: ____________ Stop time: ____________ Movements: ____________ Date: ____________ Start time: ____________ Stop time: ____________ Movements: ____________ Date: ____________ Start time: ____________ Stop time: ____________ Movements: ____________ Date: ____________ Start time: ____________ Stop time: ____________ Movements: ____________ This information is not intended to replace advice given to you by your health care provider. Make sure you discuss any questions you have with your health care provider. Document Revised: 06/23/2019 Document Reviewed: 06/23/2019 Elsevier Patient Education  2020 [employer] Inc[employer]  

## 2020-03-20 NOTE — MAU Note (Signed)
Today was her first NST. Pt has GDM.  "it didn't work, so they sent her in for NST".  No pain , no bleeding or leaking.

## 2020-03-20 NOTE — MAU Provider Note (Signed)
Chief Complaint:  Non-stress Test   First Provider Initiated Contact with Patient 03/20/20 1848     HPI: Danielle Miller is a 29 y.o. G2P1001 at [redacted]w[redacted]d who presents to maternity admissions for fetal monitoring. Patient has gestational diabetes & was in the office today for her first NST. NST wasn't reactive so she was sent here for monitoring. Denies abdominal pain, vaginal bleeding, or LOF. Good fetal movement. Next appointment is this Friday.   Past Medical History:  Diagnosis Date  . Anemia   . Anxiety   . Asthma   . Depression    doing ok  . Fibroid   . Gestational diabetes    OB History  Gravida Para Term Preterm AB Living  2 1 1     1   SAB TAB Ectopic Multiple Live Births          1    # Outcome Date GA Lbr Len/2nd Weight Sex Delivery Anes PTL Lv  2 Current           1 Term 07/12/08    F CS-Unspec   LIV    Obstetric Comments  Failed to dilate, 9#15oz ([place])   Past Surgical History:  Procedure Laterality Date  . CESAREAN SECTION     Family History  Problem Relation Age of Onset  . Diabetes Mother   . Hypertension Mother   . Healthy Father    Social History   Tobacco Use  . Smoking status: Former Smoker    Packs/day: 0.25    Types: Cigarettes  . Smokeless tobacco: Never Used  . Tobacco comment: quit in first trimester  Substance Use Topics  . Alcohol use: Not Currently    Comment: socially   . Drug use: Not Currently    Types: Marijuana    Comment: 1st trimester   Allergies  Allergen Reactions  . Sulfa Antibiotics    No medications prior to admission.    I have reviewed patient's Past Medical Hx, Surgical Hx, Family Hx, Social Hx, medications and allergies.   ROS:  Review of Systems  Constitutional: Negative.   Genitourinary: Negative.     Physical Exam   Patient Vitals for the past 24 hrs:  BP Temp Temp src Pulse Resp SpO2 Height Weight  03/20/20 2027 138/75 98.8 F (37.1 C) Oral 88 16 -- -- --  03/20/20 1955 -- -- -- -- -- 96 % --  --  03/20/20 1935 -- -- -- -- -- 98 % -- --  03/20/20 1930 -- -- -- -- -- 98 % -- --  03/20/20 1925 -- -- -- -- -- 99 % -- --  03/20/20 1920 -- -- -- -- -- 98 % -- --  03/20/20 1916 129/78 -- -- 83 -- -- -- --  03/20/20 1915 -- -- -- -- -- 99 % -- --  03/20/20 1910 -- -- -- -- -- 98 % -- --  03/20/20 1905 -- -- -- -- -- 98 % -- --  03/20/20 1900 -- -- -- -- -- 98 % -- --  03/20/20 1826 -- -- -- -- -- 98 % -- --  03/20/20 1808 131/76 98.1 F (36.7 C) Oral 90 18 100 % 5\' 7"  (1.702 m) 98.2 kg    Constitutional: Well-developed, well-nourished female in no acute distress.  Respiratory: normal effort, no respiratory distress Neurologic: Alert and oriented x 4.   NST:  Baseline: 145 bpm, Variability: Good {> 6 bpm), Accelerations: Reactive and Decelerations: Absent   Labs: No results found  for this or any previous visit (from the past 24 hour(s)).  Imaging:  No results found.  MAU Course: Orders Placed This Encounter  Procedures  . [place] MFM FETAL BPP WO NON STRESS  . Discharge patient   No orders of the defined types were placed in this encounter.   MDM: Category 1 tracing but not technically reactive by 40 minutes so BPP was ordered.  BPP 8/8.    Assessment: 1. Non-reactive NST (non-stress test)   2. [redacted] weeks gestation of pregnancy     Plan: Discharge home in stable condition.  Keep appointment in office on Friday Discussed reasons to return to MAU    Allergies as of 03/20/2020      Reactions   Sulfa Antibiotics       Medication List    STOP taking these medications   HYDROcodone-acetaminophen 5-325 MG tablet Commonly known as: NORCO/VICODIN   ibuprofen 600 MG tablet Commonly known as: ADVIL   metroNIDAZOLE 500 MG tablet Commonly known as: FLAGYL     TAKE these medications   ferrous sulfate 325 (65 FE) MG tablet Take 325 mg by mouth daily with breakfast.   metFORMIN 500 MG tablet Commonly known as: GLUCOPHAGE Take by mouth 2 (two) times daily with a  meal.   prenatal multivitamin Tabs tablet Take 1 tablet by mouth daily at 12 noon.       Jorje Guild, NP 03/20/2020 8:52 PM

## 2020-04-23 NOTE — Patient Instructions (Signed)
Danielle Miller  04/23/2020   Your procedure is scheduled on:  05/01/2020  Arrive at [facility] at Entrance C on [address] at [facility]  and [address]. You are invited to use the FREE valet parking or use the Visitor's parking deck.  Pick up the phone at the desk and dial [phone].  Call this number if you have problems the morning of surgery: [phone]  Remember:   Do not eat food:(After Midnight) Desps de medianoche.  Do not drink clear liquids: (After Midnight) Desps de medianoche.  Take these medicines the morning of surgery with A SIP OF WATER:  Do not take metformin the day before or day of surgery.   Do not wear jewelry, make-up or nail polish.  Do not wear lotions, powders, or perfumes. Do not wear deodorant.  Do not shave 48 hours prior to surgery.  Do not bring valuables to the hospital.  [facility] is not   responsible for any belongings or valuables brought to the hospital.  Contacts, dentures or bridgework may not be worn into surgery.  Leave suitcase in the car. After surgery it may be brought to your room.  For patients admitted to the hospital, checkout time is 11:00 AM the day of              discharge.      Please read over the following fact sheets that you were given:     Preparing for Surgery

## 2020-04-24 ENCOUNTER — Encounter (HOSPITAL_COMMUNITY): Payer: Self-pay

## 2020-04-29 ENCOUNTER — Other Ambulatory Visit: Payer: Self-pay

## 2020-04-29 ENCOUNTER — Other Ambulatory Visit (HOSPITAL_COMMUNITY)
Admission: RE | Admit: 2020-04-29 | Discharge: 2020-04-29 | Disposition: A | Discharge status: Home / Self Care | Payer: Medicaid Other | Source: Ambulatory Visit | Attending: Obstetrics and Gynecology | Admitting: Obstetrics and Gynecology

## 2020-04-29 DIAGNOSIS — Z20822 Contact with and (suspected) exposure to covid-19: Secondary | ICD-10-CM | POA: Insufficient documentation

## 2020-04-29 LAB — CBC
HCT: 38 % (ref 36.0–46.0)
Hemoglobin: 12.2 g/dL (ref 12.0–15.0)
MCH: 26.9 pg (ref 26.0–34.0)
MCHC: 32.1 g/dL (ref 30.0–36.0)
MCV: 83.9 fL (ref 80.0–100.0)
Platelets: 185 10*3/uL (ref 150–400)
RBC: 4.53 MIL/uL (ref 3.87–5.11)
RDW: 14.4 % (ref 11.5–15.5)
WBC: 6.8 10*3/uL (ref 4.0–10.5)
nRBC: 0 % (ref 0.0–0.2)

## 2020-04-29 LAB — ABO/RH: ABO/RH(D): O POS

## 2020-04-29 LAB — RPR: RPR Ser Ql: NONREACTIVE

## 2020-04-29 NOTE — MAU Note (Addendum)
Pt here for covid swab and lab draw. Denies symptoms or sick contacts.   While RN was collecting swab pt pulled swab out of her nose and states she did not want to proceed with the covid swab. RN informed patient that she would be treated as positive and that support person would not be allowed into the OR with her. Pt verbalizes understanding and states that nobody will come with her then.   Pt to wait in the lobby for lab draw.

## 2020-04-30 ENCOUNTER — Encounter (HOSPITAL_COMMUNITY): Payer: Self-pay | Admitting: Obstetrics and Gynecology

## 2020-04-30 LAB — TYPE AND SCREEN
ABO/RH(D): O POS
Antibody Screen: NEGATIVE

## 2020-04-30 NOTE — H&P (Signed)
Danielle Miller is a 29 y.o. female, G2 P1001, EGA [redacted] weeks with [facility] 6-21 presenting for repeat c-section.  Prenatal care complicated by previous LTCS, she declines VBAC.  A2GDM controlled with Metformin, reassuring antenatal testing.  OB History    Gravida  2   Para  1   Term  1   Preterm      AB      Living  1     SAB      TAB      Ectopic      Multiple      Live Births  1        Obstetric Comments  Failed to dilate, 9#15oz ([place])       Past Medical History:  Diagnosis Date  . Anemia   . Anxiety   . Asthma   . Depression    doing ok  . Fibroid   . Gestational diabetes    Past Surgical History:  Procedure Laterality Date  . CESAREAN SECTION     Family History: family history includes Diabetes in her mother; Healthy in her father; Hypertension in her mother. Social History:  reports that she has quit smoking. Her smoking use included cigarettes. She smoked 0.25 packs per day. She has never used smokeless tobacco. She reports previous alcohol use. She reports previous drug use. Drug: Marijuana.     Maternal Diabetes: Yes:  Diabetes Type:  Insulin/Medication controlled Genetic Screening: Declined Maternal Ultrasounds/Referrals: Normal Fetal Ultrasounds or other Referrals:  None Maternal Substance Abuse:  No Significant Maternal Medications:  None Significant Maternal Lab Results:  Group B Strep negative Other Comments:  None  Review of Systems  Respiratory: Negative.   Cardiovascular: Negative.    Maternal Medical History:  Fetal activity: Perceived fetal activity is normal.    Prenatal complications: no prenatal complications Prenatal Complications - Diabetes: gestational. Diabetes is managed by oral agent (monotherapy).        Last menstrual period 08/01/2019. Maternal Exam:  Abdomen: Patient reports no abdominal tenderness. Surgical scars: low transverse.   Estimated fetal weight is 8 lbs.    Introitus: Normal vulva. Normal vagina.   Amniotic fluid character: not assessed.     Physical Exam  Cardiovascular: Normal rate, regular rhythm and normal heart sounds.  No murmur heard. Respiratory: Effort normal and breath sounds normal. No respiratory distress.  GI: Soft. Normal appearance.  Genitourinary:    Vulva normal.   Musculoskeletal:     Cervical back: Normal range of motion and neck supple.  Neurological: She is alert.    Prenatal labs: ABO, Rh: --/--/O POS, O POS Performed at [facility], [place] [address] [place], [place] [address]  [phone]) Antibody: NEG (06/13 0855) Rubella:  immune RPR: NON REACTIVE (06/13 0851)  HBsAg:   neg HIV:   NR GBS:   neg  Assessment/Plan: IUP at 39 weeks, previous LTCS-declines VBAC, A2GDM controlled with Metformin.  C-section procedure and risks discussed.  Will admit for repeat c-section   Clarene Duke 04/30/2020, 1:26 PM

## 2020-05-01 ENCOUNTER — Encounter (HOSPITAL_COMMUNITY)
Admission: RE | Disposition: A | Discharge status: Home / Self Care | Payer: Self-pay | Source: Home / Self Care | Attending: Obstetrics and Gynecology

## 2020-05-01 ENCOUNTER — Inpatient Hospital Stay (HOSPITAL_COMMUNITY): Payer: Medicaid Other

## 2020-05-01 ENCOUNTER — Encounter (HOSPITAL_COMMUNITY): Payer: Self-pay | Admitting: Obstetrics and Gynecology

## 2020-05-01 ENCOUNTER — Other Ambulatory Visit: Payer: Self-pay

## 2020-05-01 ENCOUNTER — Inpatient Hospital Stay (HOSPITAL_COMMUNITY)
Admission: RE | Admit: 2020-05-01 | Discharge: 2020-05-03 | DRG: 787 | Disposition: A | Discharge status: Home / Self Care | Payer: Medicaid Other | Attending: Obstetrics and Gynecology | Admitting: Obstetrics and Gynecology

## 2020-05-01 DIAGNOSIS — Z98891 History of uterine scar from previous surgery: Secondary | ICD-10-CM

## 2020-05-01 DIAGNOSIS — Z3A39 39 weeks gestation of pregnancy: Secondary | ICD-10-CM | POA: Diagnosis not present

## 2020-05-01 DIAGNOSIS — O24425 Gestational diabetes mellitus in childbirth, controlled by oral hypoglycemic drugs: Secondary | ICD-10-CM | POA: Diagnosis present

## 2020-05-01 DIAGNOSIS — O34211 Maternal care for low transverse scar from previous cesarean delivery: Secondary | ICD-10-CM | POA: Diagnosis present

## 2020-05-01 DIAGNOSIS — D62 Acute posthemorrhagic anemia: Secondary | ICD-10-CM | POA: Diagnosis not present

## 2020-05-01 DIAGNOSIS — O9902 Anemia complicating childbirth: Secondary | ICD-10-CM | POA: Diagnosis not present

## 2020-05-01 DIAGNOSIS — Z87891 Personal history of nicotine dependence: Secondary | ICD-10-CM

## 2020-05-01 LAB — COMPREHENSIVE METABOLIC PANEL
ALT: 31 U/L (ref 0–44)
AST: 31 U/L (ref 15–41)
Albumin: 3.2 g/dL — ABNORMAL LOW (ref 3.5–5.0)
Alkaline Phosphatase: 110 U/L (ref 38–126)
Anion gap: 10 (ref 5–15)
BUN: 6 mg/dL (ref 6–20)
CO2: 20 mmol/L — ABNORMAL LOW (ref 22–32)
Calcium: 9.3 mg/dL (ref 8.9–10.3)
Chloride: 104 mmol/L (ref 98–111)
Creatinine, Ser: 0.66 mg/dL (ref 0.44–1.00)
GFR calc Af Amer: >60 mL/min (ref >60)
GFR calc non Af Amer: >60 mL/min (ref >60)
Glucose, Bld: 101 mg/dL — ABNORMAL HIGH (ref 70–99)
Potassium: 3.9 mmol/L (ref 3.5–5.1)
Sodium: 134 mmol/L — ABNORMAL LOW (ref 135–145)
Total Bilirubin: 0.6 mg/dL (ref 0.3–1.2)
Total Protein: 6.7 g/dL (ref 6.5–8.1)

## 2020-05-01 LAB — GLUCOSE, CAPILLARY
Glucose-Capillary: 111 mg/dL — ABNORMAL HIGH (ref 70–99)
Glucose-Capillary: 99 mg/dL (ref 70–99)

## 2020-05-01 SURGERY — Surgical Case
Anesthesia: Spinal

## 2020-05-01 MED ORDER — SODIUM CHLORIDE 0.9 % IV SOLN
INTRAVENOUS | Status: DC | PRN
Start: 2020-05-01 — End: 2020-05-01

## 2020-05-01 MED ORDER — FENTANYL CITRATE (PF) 100 MCG/2ML IJ SOLN
INTRAMUSCULAR | Status: AC
  Filled 2020-05-01: qty 2

## 2020-05-01 MED ORDER — METOCLOPRAMIDE HCL 5 MG/ML IJ SOLN
INTRAMUSCULAR | Status: AC
  Filled 2020-05-01: qty 2

## 2020-05-01 MED ORDER — OXYCODONE HCL 5 MG PO TABS
5.0000 mg | ORAL_TABLET | ORAL | Status: DC | PRN
  Administered 2020-05-02: 5 mg via ORAL
  Filled 2020-05-01: qty 1

## 2020-05-01 MED ORDER — STERILE WATER FOR IRRIGATION IR SOLN
Status: DC | PRN
  Administered 2020-05-01: 1

## 2020-05-01 MED ORDER — NALBUPHINE HCL 10 MG/ML IJ SOLN: 5.0000 mg | Freq: Once | INTRAMUSCULAR | Status: DC | PRN

## 2020-05-01 MED ORDER — NALOXONE HCL 0.4 MG/ML IJ SOLN: 0.4000 mg | INTRAMUSCULAR | Status: DC | PRN

## 2020-05-01 MED ORDER — KETOROLAC TROMETHAMINE 30 MG/ML IJ SOLN: 30.0000 mg | Freq: Four times a day (QID) | INTRAMUSCULAR | Status: AC | PRN

## 2020-05-01 MED ORDER — MEPERIDINE HCL 25 MG/ML IJ SOLN: 6.2500 mg | INTRAMUSCULAR | Status: DC | PRN

## 2020-05-01 MED ORDER — FENTANYL CITRATE (PF) 100 MCG/2ML IJ SOLN
INTRAMUSCULAR | Status: DC | PRN
  Administered 2020-05-01: 15 ug via INTRATHECAL

## 2020-05-01 MED ORDER — ONDANSETRON HCL 4 MG/2ML IJ SOLN: 4.0000 mg | Freq: Three times a day (TID) | INTRAMUSCULAR | Status: DC | PRN

## 2020-05-01 MED ORDER — WITCH HAZEL-GLYCERIN EX PADS: 1.0000 "application " | MEDICATED_PAD | CUTANEOUS | Status: DC | PRN

## 2020-05-01 MED ORDER — OXYTOCIN-SODIUM CHLORIDE 30-0.9 UT/500ML-% IV SOLN
INTRAVENOUS | Status: AC
  Filled 2020-05-01: qty 500

## 2020-05-01 MED ORDER — HYDROMORPHONE HCL 1 MG/ML IJ SOLN: 0.2500 mg | INTRAMUSCULAR | Status: DC | PRN

## 2020-05-01 MED ORDER — LACTATED RINGERS IV SOLN: INTRAVENOUS | Status: DC

## 2020-05-01 MED ORDER — NALBUPHINE HCL 10 MG/ML IJ SOLN: 5.0000 mg | INTRAMUSCULAR | Status: DC | PRN

## 2020-05-01 MED ORDER — DEXAMETHASONE SODIUM PHOSPHATE 4 MG/ML IJ SOLN
INTRAMUSCULAR | Status: DC | PRN
  Administered 2020-05-01: 4 mg via INTRAVENOUS

## 2020-05-01 MED ORDER — OXYTOCIN-SODIUM CHLORIDE 30-0.9 UT/500ML-% IV SOLN
INTRAVENOUS | Status: AC
  Filled 2020-05-01: qty 1000

## 2020-05-01 MED ORDER — DIPHENHYDRAMINE HCL 25 MG PO CAPS
25.0000 mg | ORAL_CAPSULE | ORAL | Status: DC | PRN
  Administered 2020-05-01: 25 mg via ORAL
  Filled 2020-05-01: qty 1

## 2020-05-01 MED ORDER — METHYLERGONOVINE MALEATE 0.2 MG PO TABS: 0.2000 mg | ORAL_TABLET | ORAL | Status: DC | PRN

## 2020-05-01 MED ORDER — MENTHOL 3 MG MT LOZG: 1.0000 | LOZENGE | OROMUCOSAL | Status: DC | PRN

## 2020-05-01 MED ORDER — SCOPOLAMINE 1 MG/3DAYS TD PT72
MEDICATED_PATCH | TRANSDERMAL | Status: AC
  Filled 2020-05-01: qty 1

## 2020-05-01 MED ORDER — DIBUCAINE (PERIANAL) 1 % EX OINT: 1.0000 "application " | TOPICAL_OINTMENT | CUTANEOUS | Status: DC | PRN

## 2020-05-01 MED ORDER — MEASLES, MUMPS & RUBELLA VAC IJ SOLR: 0.5000 mL | Freq: Once | INTRAMUSCULAR | Status: DC

## 2020-05-01 MED ORDER — METHYLERGONOVINE MALEATE 0.2 MG/ML IJ SOLN: 0.2000 mg | INTRAMUSCULAR | Status: DC | PRN

## 2020-05-01 MED ORDER — IBUPROFEN 800 MG PO TABS
800.0000 mg | ORAL_TABLET | Freq: Three times a day (TID) | ORAL | Status: DC
  Administered 2020-05-01 – 2020-05-03 (×5): 800 mg via ORAL
  Filled 2020-05-01 (×5): qty 1

## 2020-05-01 MED ORDER — CEFAZOLIN SODIUM-DEXTROSE 2-3 GM-%(50ML) IV SOLR
INTRAVENOUS | Status: DC | PRN
  Administered 2020-05-01: 2 g via INTRAVENOUS

## 2020-05-01 MED ORDER — LACTATED RINGERS IV SOLN: INTRAVENOUS | Status: DC | PRN

## 2020-05-01 MED ORDER — PHENYLEPHRINE HCL-NACL 20-0.9 MG/250ML-% IV SOLN
INTRAVENOUS | Status: AC
  Filled 2020-05-01: qty 250

## 2020-05-01 MED ORDER — CEFAZOLIN SODIUM-DEXTROSE 2-4 GM/100ML-% IV SOLN
INTRAVENOUS | Status: AC
  Filled 2020-05-01: qty 100

## 2020-05-01 MED ORDER — SODIUM CHLORIDE 0.9% FLUSH: 3.0000 mL | INTRAVENOUS | Status: DC | PRN

## 2020-05-01 MED ORDER — TETANUS-DIPHTH-ACELL PERTUSSIS 5-2.5-18.5 LF-MCG/0.5 IM SUSP: 0.5000 mL | Freq: Once | INTRAMUSCULAR | Status: DC

## 2020-05-01 MED ORDER — KETOROLAC TROMETHAMINE 30 MG/ML IJ SOLN
INTRAMUSCULAR | Status: AC
  Filled 2020-05-01: qty 1

## 2020-05-01 MED ORDER — SODIUM CHLORIDE 0.9 % IR SOLN
Status: DC | PRN
  Administered 2020-05-01: 1

## 2020-05-01 MED ORDER — ONDANSETRON HCL 4 MG/2ML IJ SOLN
INTRAMUSCULAR | Status: DC | PRN
  Administered 2020-05-01: 4 mg via INTRAVENOUS

## 2020-05-01 MED ORDER — PRENATAL MULTIVITAMIN CH
1.0000 | ORAL_TABLET | Freq: Every day | ORAL | Status: DC
  Administered 2020-05-01 – 2020-05-02 (×2): 1 via ORAL
  Filled 2020-05-01 (×2): qty 1

## 2020-05-01 MED ORDER — ACETAMINOPHEN 500 MG PO TABS
1000.0000 mg | ORAL_TABLET | Freq: Four times a day (QID) | ORAL | Status: DC
  Administered 2020-05-01 – 2020-05-03 (×7): 1000 mg via ORAL
  Filled 2020-05-01 (×7): qty 2

## 2020-05-01 MED ORDER — PHENYLEPHRINE 40 MCG/ML (10ML) SYRINGE FOR IV PUSH (FOR BLOOD PRESSURE SUPPORT)
PREFILLED_SYRINGE | INTRAVENOUS | Status: AC
  Filled 2020-05-01: qty 10

## 2020-05-01 MED ORDER — SENNOSIDES-DOCUSATE SODIUM 8.6-50 MG PO TABS
2.0000 | ORAL_TABLET | ORAL | Status: DC
  Administered 2020-05-01 – 2020-05-02 (×2): 2 via ORAL
  Filled 2020-05-01 (×3): qty 2

## 2020-05-01 MED ORDER — ONDANSETRON HCL 4 MG/2ML IJ SOLN
INTRAMUSCULAR | Status: AC
  Filled 2020-05-01: qty 2

## 2020-05-01 MED ORDER — DIPHENHYDRAMINE HCL 25 MG PO CAPS: 25.0000 mg | ORAL_CAPSULE | Freq: Four times a day (QID) | ORAL | Status: DC | PRN

## 2020-05-01 MED ORDER — MORPHINE SULFATE (PF) 0.5 MG/ML IJ SOLN
INTRAMUSCULAR | Status: DC | PRN
  Administered 2020-05-01: 150 ug via INTRATHECAL

## 2020-05-01 MED ORDER — OXYTOCIN-SODIUM CHLORIDE 30-0.9 UT/500ML-% IV SOLN
INTRAVENOUS | Status: DC | PRN
  Administered 2020-05-01: 30 [IU] via INTRAVENOUS

## 2020-05-01 MED ORDER — PHENYLEPHRINE HCL-NACL 20-0.9 MG/250ML-% IV SOLN
INTRAVENOUS | Status: DC | PRN
  Administered 2020-05-01: 60 ug/min via INTRAVENOUS

## 2020-05-01 MED ORDER — MAGNESIUM HYDROXIDE 400 MG/5ML PO SUSP
30.0000 mL | ORAL | Status: DC | PRN
  Filled 2020-05-01: qty 30

## 2020-05-01 MED ORDER — ACETAMINOPHEN 500 MG PO TABS: 1000.0000 mg | ORAL_TABLET | Freq: Four times a day (QID) | ORAL | Status: DC

## 2020-05-01 MED ORDER — PROMETHAZINE HCL 25 MG/ML IJ SOLN: 6.2500 mg | INTRAMUSCULAR | Status: DC | PRN

## 2020-05-01 MED ORDER — ZOLPIDEM TARTRATE 5 MG PO TABS: 5.0000 mg | ORAL_TABLET | Freq: Every evening | ORAL | Status: DC | PRN

## 2020-05-01 MED ORDER — COCONUT OIL OIL: 1.0000 "application " | TOPICAL_OIL | Status: DC | PRN

## 2020-05-01 MED ORDER — MORPHINE SULFATE (PF) 0.5 MG/ML IJ SOLN
INTRAMUSCULAR | Status: AC
  Filled 2020-05-01: qty 10

## 2020-05-01 MED ORDER — OXYTOCIN-SODIUM CHLORIDE 30-0.9 UT/500ML-% IV SOLN: 2.5000 [IU]/h | INTRAVENOUS | Status: AC

## 2020-05-01 MED ORDER — BUPIVACAINE IN DEXTROSE 0.75-8.25 % IT SOLN
INTRATHECAL | Status: DC | PRN
  Administered 2020-05-01: 1.6 mL via INTRATHECAL

## 2020-05-01 MED ORDER — NALOXONE HCL 4 MG/10ML IJ SOLN
1.0000 ug/kg/h | INTRAVENOUS | Status: DC | PRN
  Filled 2020-05-01: qty 5

## 2020-05-01 MED ORDER — DIPHENHYDRAMINE HCL 50 MG/ML IJ SOLN
12.5000 mg | INTRAMUSCULAR | Status: DC | PRN
  Filled 2020-05-01: qty 1

## 2020-05-01 MED ORDER — SCOPOLAMINE 1 MG/3DAYS TD PT72
1.0000 | MEDICATED_PATCH | Freq: Once | TRANSDERMAL | Status: DC
  Administered 2020-05-01: 1.5 mg via TRANSDERMAL

## 2020-05-01 MED ORDER — DEXAMETHASONE SODIUM PHOSPHATE 4 MG/ML IJ SOLN
INTRAMUSCULAR | Status: AC
  Filled 2020-05-01: qty 2

## 2020-05-01 MED ORDER — KETOROLAC TROMETHAMINE 30 MG/ML IJ SOLN
30.0000 mg | Freq: Once | INTRAMUSCULAR | Status: AC | PRN
  Administered 2020-05-01: 30 mg via INTRAVENOUS

## 2020-05-01 MED ORDER — METOCLOPRAMIDE HCL 5 MG/ML IJ SOLN
INTRAMUSCULAR | Status: DC | PRN
  Administered 2020-05-01: 10 mg via INTRAVENOUS

## 2020-05-01 MED ORDER — SIMETHICONE 80 MG PO CHEW
80.0000 mg | CHEWABLE_TABLET | ORAL | Status: DC | PRN
  Administered 2020-05-01 – 2020-05-02 (×2): 80 mg via ORAL
  Filled 2020-05-01 (×2): qty 1

## 2020-05-01 MED ORDER — CEFAZOLIN SODIUM-DEXTROSE 2-4 GM/100ML-% IV SOLN: 2.0000 g | INTRAVENOUS | Status: DC

## 2020-05-01 MED ORDER — KETOROLAC TROMETHAMINE 30 MG/ML IJ SOLN: 30.0000 mg | Freq: Four times a day (QID) | INTRAMUSCULAR | Status: AC

## 2020-05-01 SURGICAL SUPPLY — 36 items
BENZOIN TINCTURE PRP APPL 2/3 (GAUZE/BANDAGES/DRESSINGS) ×2 IMPLANT
CHLORAPREP W/TINT 26ML (MISCELLANEOUS) ×2 IMPLANT
CLAMP CORD UMBIL (MISCELLANEOUS) IMPLANT
CLOTH BEACON ORANGE TIMEOUT ST (SAFETY) ×2 IMPLANT
DRSG OPSITE POSTOP 4X10 (GAUZE/BANDAGES/DRESSINGS) ×2 IMPLANT
ELECT REM PT RETURN 9FT ADLT (ELECTROSURGICAL) ×2
ELECTRODE REM PT RTRN 9FT ADLT (ELECTROSURGICAL) ×1 IMPLANT
EXTRACTOR VACUUM KIWI (MISCELLANEOUS) IMPLANT
EXTRACTOR VACUUM M CUP 4 TUBE (SUCTIONS) ×2 IMPLANT
GLOVE BIOGEL PI IND STRL 6.5 (GLOVE) ×1 IMPLANT
GLOVE BIOGEL PI IND STRL 7.0 (GLOVE) ×1 IMPLANT
GLOVE BIOGEL PI INDICATOR 6.5 (GLOVE) ×1
GLOVE BIOGEL PI INDICATOR 7.0 (GLOVE) ×1
GLOVE ECLIPSE 7.0 STRL STRAW (GLOVE) ×2 IMPLANT
GLOVE ORTHO TXT STRL SZ7.5 (GLOVE) ×2 IMPLANT
GOWN STRL REUS W/TWL LRG LVL3 (GOWN DISPOSABLE) ×6 IMPLANT
KIT ABG SYR 3ML LUER SLIP (SYRINGE) IMPLANT
NEEDLE HYPO 25X5/8 SAFETYGLIDE (NEEDLE) ×2 IMPLANT
NS IRRIG 1000ML POUR BTL (IV SOLUTION) ×2 IMPLANT
PACK C SECTION WH (CUSTOM PROCEDURE TRAY) ×2 IMPLANT
PAD OB MATERNITY 4.3X12.25 (PERSONAL CARE ITEMS) ×2 IMPLANT
PENCIL SMOKE EVAC W/HOLSTER (ELECTROSURGICAL) ×2 IMPLANT
RTRCTR C-SECT PINK 25CM LRG (MISCELLANEOUS) ×2 IMPLANT
STRIP CLOSURE SKIN 1/2X4 (GAUZE/BANDAGES/DRESSINGS) ×2 IMPLANT
SUT CHROMIC 1 CTX 36 (SUTURE) ×4 IMPLANT
SUT PLAIN 0 NONE (SUTURE) IMPLANT
SUT PLAIN 2 0 (SUTURE) ×1
SUT PLAIN 2 0 XLH (SUTURE) IMPLANT
SUT PLAIN ABS 2-0 CT1 27XMFL (SUTURE) ×1 IMPLANT
SUT VIC AB 0 CT1 27 (SUTURE) ×2
SUT VIC AB 0 CT1 27XBRD ANBCTR (SUTURE) ×2 IMPLANT
SUT VIC AB 2-0 CT1 (SUTURE) ×2 IMPLANT
SUT VIC AB 4-0 KS 27 (SUTURE) IMPLANT
TOWEL OR 17X24 6PK STRL BLUE (TOWEL DISPOSABLE) ×2 IMPLANT
TRAY FOLEY W/BAG SLVR 14FR LF (SET/KITS/TRAYS/PACK) ×2 IMPLANT
WATER STERILE IRR 1000ML POUR (IV SOLUTION) ×2 IMPLANT

## 2020-05-01 NOTE — Anesthesia Postprocedure Evaluation (Signed)
Anesthesia Post Note  Patient: Danielle Miller  Procedure(s) Performed: CESAREAN SECTION (N/A )     Patient location during evaluation: PACU Anesthesia Type: Spinal Level of consciousness: awake Pain management: pain level controlled Vital Signs Assessment: post-procedure vital signs reviewed and stable Respiratory status: spontaneous breathing Cardiovascular status: stable Postop Assessment: no headache, no backache, spinal receding, no apparent nausea or vomiting and patient able to bend at knees Anesthetic complications: no   No complications documented.  Last Vitals:  Vitals:   05/01/20 0958 05/01/20 1100  BP: 119/87 120/81  Pulse: 94 98  Resp: 16 17  Temp: 36.6 C 36.9 C  SpO2: 98% 98%    Last Pain:  Vitals:   05/01/20 1100  TempSrc: Oral  PainSc: 0-No pain   Pain Goal:                   Huston Foley

## 2020-05-01 NOTE — Transfer of Care (Addendum)
Immediate Anesthesia Transfer of Care Note  Patient: Danielle Miller  Procedure(s) Performed: CESAREAN SECTION (N/A )  Patient Location: PACU  Anesthesia Type:Spinal  Level of Consciousness: awake, alert  and oriented  Airway & Oxygen Therapy: Patient Spontanous Breathing  Post-op Assessment: Report given to RN and Post -op Vital signs reviewed and stable  Post vital signs: Reviewed and stable  Last Vitals:  Vitals Value Taken Time  BP 119/89 05/01/20 0930  Temp 36.4 C 05/01/20 0846  Pulse 79 05/01/20 0934  Resp 20 05/01/20 0934  SpO2 98 % 05/01/20 0934  Vitals shown include unvalidated device data.  Last Pain:  Vitals:   05/01/20 0915  TempSrc:   PainSc: 0-No pain         Complications: No complications documented.

## 2020-05-01 NOTE — Interval H&P Note (Signed)
History and Physical Interval Note:  05/01/2020 7:14 AM  Danielle Miller  has presented today for surgery, with the diagnosis of repeat c-section.  The various methods of treatment have been discussed with the patient and family. After consideration of risks, benefits and other options for treatment, the patient has consented to  Procedure(s) with comments: CESAREAN SECTION (N/A) - [place] as a surgical intervention.  The patient's history has been reviewed, patient examined, no change in status, stable for surgery.  I have reviewed the patient's chart and labs.  Questions were answered to the patient's satisfaction. She refused pre-op COVID test, so will be treated as positive, discussed the implications of this.    Blane Ohara Danielle Miller

## 2020-05-01 NOTE — Progress Notes (Signed)
RN called to the room by patient because she said her IV was beeping. Upon entering the room, RN found the patient's IV completely out and the site bleeding down her hand. Pressure was applied for approximately 45 seconds until the bleeding subsided. Approximately 100 ml left to infuse from bag of Pitocin. Pt has adequate output and fundus is firm with small bleeding. RN called Dr. Terri Piedra to update on patient status. After reviewing case, MD advised RN that patient does not need a new IV started at this time but we will continue to monitor bleeding, output, and overall assessment.

## 2020-05-01 NOTE — Op Note (Signed)
Preoperative diagnosis: Intrauterine pregnancy at 39 weeks, previous c-section Postoperative diagnosis: Same Procedure: Repeat low transverse cesarean section without extensions Surgeon: Cheri Fowler M.D. Anesthesia: Spinal  Findings: Patient had normal gravid anatomy and delivered a viable female infant with Apgars of 8 and 9 weight pending Estimated blood loss: 300 cc Specimens: Placenta sent to labor and delivery Complications: None  Procedure in detail: The patient was taken to the operating room and placed in the sitting position. Dr. Jillyn Hidden instilled spinal anesthesia.  She was then placed in the dorsosupine position with left tilt. Abdomen was then prepped and draped in the usual sterile fashion, and a foley catheter was inserted. The level of her anesthesia was found to be adequate. Abdomen was entered via a standard Pfannenstiel incision through her previous scar, no significant adhesions. Once the peritoneal cavity was entered the Alexis disposable self-retaining retractor was placed and good visualization was achieved. A 4 cm transverse incision was then made in the lower uterine segment pushing the bladder inferior. Once the uterine cavity was entered the incision was extended digitally, clear amniotic fluid. The fetal vertex was grasped and delivered through the incision with vacuum assistance. Mouth and nares were suctioned. The remainder of the infant then delivered atraumatically. Cord was doubly clamped and cut after one minute and the infant handed to the awaiting pediatric team. Cord blood was obtained. The placenta delivered spontaneously. Uterus was wiped dry with clean lap pad and all clots and debris were removed. Uterine incision was inspected and found to be free of extensions. Uterine incision was closed in 1 layer with running locking #1 Chromic. Bleeding from each angle was controlled with figure 8 #1 Chromic with hemostasis. Tubes and ovaries were inspected and found to be  normal. Uterine incision was inspected and found to be hemostatic. Bleeding from serosal edges was controlled with electrocautery. The Alexis retractor was removed. Subfascial space was irrigated and made hemostatic with electrocautery. Peritoneum was closed with 2-0 Vicryl.  Fascia was closed in running fashion starting at both ends and meeting in the middle with 0 Vicryl. Subcutaneous tissue was then irrigated and made hemostatic with electrocautery, then closed with running 2-0 plain gut. Skin was closed with running 4-0 Vicryl subcuticular suture followed by steri-strips and a sterile dressing. Patient tolerated the procedure well and was taken to the recovery in stable condition. Counts were correct x2, she received Ancef 2 g IV at the beginning of the procedure and she had PAS hose on throughout the procedure.

## 2020-05-01 NOTE — Progress Notes (Signed)
Patient ID: Danielle Miller, female   DOB: 04/10/1991, 29 y.o.   MRN: [id number] Chart check   Stable vitals  Continue with routine pp/post op care

## 2020-05-01 NOTE — Anesthesia Preprocedure Evaluation (Signed)
Anesthesia Evaluation  Patient identified by MRN, date of birth, ID band Patient awake    Reviewed: Allergy & Precautions, H&P , NPO status , Patient's Chart, lab work & pertinent test results  Airway Mallampati: I  TM Distance: >3 FB Neck ROM: full    Dental no notable dental hx. (+) Teeth Intact   Pulmonary asthma , former smoker,    Pulmonary exam normal breath sounds clear to auscultation       Cardiovascular negative cardio ROS Normal cardiovascular exam Rhythm:regular Rate:Normal     Neuro/Psych PSYCHIATRIC DISORDERS Anxiety Depression negative neurological ROS     GI/Hepatic negative GI ROS, Neg liver ROS,   Endo/Other  negative endocrine ROSdiabetes, Gestational, Oral Hypoglycemic Agents  Renal/GU negative Renal ROS  negative genitourinary   Musculoskeletal   Abdominal (+) + obese,   Peds  Hematology   Anesthesia Other Findings   Reproductive/Obstetrics (+) Pregnancy                             Anesthesia Physical Anesthesia Plan  ASA: II  Anesthesia Plan: Spinal   Post-op Pain Management:    Induction:   PONV Risk Score and Plan: 3 and Ondansetron, Dexamethasone and Scopolamine patch - Pre-op  Airway Management Planned: Nasal Cannula and Natural Airway  Additional Equipment: None  Intra-op Plan:   Post-operative Plan:   Informed Consent: I have reviewed the patients History and Physical, chart, labs and discussed the procedure including the risks, benefits and alternatives for the proposed anesthesia with the patient or authorized representative who has indicated his/her understanding and acceptance.       Plan Discussed with: CRNA  Anesthesia Plan Comments:         Anesthesia Quick Evaluation

## 2020-05-01 NOTE — Anesthesia Procedure Notes (Addendum)
Spinal  Patient location during procedure: OR Start time: 05/01/2020 7:24 AM End time: 05/01/2020 7:27 AM Staffing Performed: other anesthesia staff  Other anesthesia staff: Antonieta Pert, RN Preanesthetic Checklist Completed: patient identified, IV checked, site marked, risks and benefits discussed, surgical consent, monitors and equipment checked, pre-op evaluation and timeout performed Spinal Block Patient position: sitting Prep: Betasept Patient monitoring: heart rate, continuous pulse ox and blood pressure Approach: midline Location: L3-4 Injection technique: single-shot Needle Needle type: Pencan  Needle gauge: 24 G Needle length: 10 cm Assessment Sensory level: T4

## 2020-05-02 LAB — CBC
HCT: 29.4 % — ABNORMAL LOW (ref 36.0–46.0)
Hemoglobin: 9.7 g/dL — ABNORMAL LOW (ref 12.0–15.0)
MCH: 27.6 pg (ref 26.0–34.0)
MCHC: 33 g/dL (ref 30.0–36.0)
MCV: 83.8 fL (ref 80.0–100.0)
Platelets: 175 10*3/uL (ref 150–400)
RBC: 3.51 MIL/uL — ABNORMAL LOW (ref 3.87–5.11)
RDW: 14 % (ref 11.5–15.5)
WBC: 12.7 10*3/uL — ABNORMAL HIGH (ref 4.0–10.5)
nRBC: 0 % (ref 0.0–0.2)

## 2020-05-02 LAB — BIRTH TISSUE RECOVERY COLLECTION (PLACENTA DONATION)

## 2020-05-02 NOTE — Progress Notes (Signed)
Subjective: Postpartum Day #1: Cesarean Delivery Patient reports incisional pain, tolerating PO and no problems voiding.    Objective: Vital signs in last 24 hours: Temp:  [97.7 F (36.5 C)-98.4 F (36.9 C)] 98 F (36.7 C) (06/16 0802) Pulse Rate:  [62-98] 65 (06/16 0802) Resp:  [15-19] 17 (06/16 0802) BP: (104-126)/(59-90) 110/78 (06/16 0802) SpO2:  [97 %-100 %] 100 % (06/16 0802)  Physical Exam:  General: alert Lochia: appropriate Uterine Fundus: firm Incision: dressing C/D/I   Recent Labs    05/02/20 0544  HGB 9.7*  HCT 29.4*    Assessment/Plan: Status post Cesarean section. Doing well postoperatively.  Continue current care, ambulate in room, on COVID isolation since refused COVID test.  Blane Ohara Harry Bark 05/02/2020, 8:53 AM

## 2020-05-02 NOTE — Lactation Note (Addendum)
This note was copied from a baby's chart. Lactation Consultation Note  Patient Name: Danielle Miller [id number]'[id number] Date: 05/02/2020 Reason for consult: Initial assessment;1st time breastfeeding;Term P2, 20 hour term female infant. Per mom, infant had one void and 4 stools, LC change large meconium stool while in room partially green. Per mom, infant latches well right breast but having difficulty latching infant on her left breast. Mom concern she doesn't have colostrum to give infant or he is not getting enough she has been giving infant formula for most feeding in past 10 hours. LC explained infant's small tummy size, LC  suggested mom latch infant at breast for  every feeding to help establish her milk supply and then supplement infant with formula after latching infant at breast. LC discussed hand expression mom had smear colostrum present, mom plans to use DEBP to help with breast stimulation and milk production. Mom is breast and formula feeding infant, this is her first time breastfeeding, she did not breastfed her son. Mom wanted assistance with latching infant on her left breast. Mom latched infant on her left breast using the football hold position, LC offered pillow support and ask mom to support her breast using 'U hold" position, infant latched with nose and chin touching breast. Swallows observed, infant sustained latch and was still breastfeeding after 15 minutes. [place] set mom up with DEBP, mom plans to pump every 3 hours for 15 minutes.  Mom shown how to use DEBP & how to disassemble, clean, & reassemble parts. Mom knows to breastfeed according hunger cues, 8 to 12+ times within 24 hours and not exceed 3 hours without breastfeeding infant. Mom knows to call RN or [place] if she needs assistance with latching infant at breast. Mom made aware of O/P services, breastfeeding support groups, community resources, and our phone # for post-discharge questions.  Mom's short-term goal: 1. Latch  infant at breast with each feeding according to hunger cues. 2. Supplement infant with formula after latching infant at breast according to infant age/ hours of life. 3. Pump every 3 hours for 15 minutes to help establish supply and give infant back any EBM. 4. Do STS with infant as much as possible.  Maternal Data Formula Feeding for Exclusion: Yes Reason for exclusion: Mother's choice to formula and breast feed on admission Has patient been taught Hand Expression?: Yes Does the patient have breastfeeding experience prior to this delivery?: No  Feeding Feeding Type: Breast Fed  LATCH Score Latch: Grasps breast easily, tongue down, lips flanged, rhythmical sucking.  Audible Swallowing: Spontaneous and intermittent  Type of Nipple: Everted at rest and after stimulation  Comfort (Breast/Nipple): Soft / non-tender  Hold (Positioning): Assistance needed to correctly position infant at breast and maintain latch.  LATCH Score: 9  Interventions Interventions: Breast feeding basics reviewed;Breast compression;Assisted with latch;Adjust position;Skin to skin;Support pillows;DEBP;Breast massage;Position options;Hand express;Expressed milk  Lactation Tools Discussed/Used WIC Program: Yes Pump Review: Setup, frequency, and cleaning;Milk Storage Initiated by:: Vicente Serene, IBCLC Date initiated:: 05/02/20   Consult Status Consult Status: Follow-up Date: 05/02/20 Follow-up type: In-patient    Vicente Serene 05/02/2020, 4:00 AM

## 2020-05-02 NOTE — Clinical Social Work Maternal (Signed)
CLINICAL SOCIAL WORK MATERNAL/CHILD NOTE  Patient Details  Name: Danielle Miller MRN: [id number] Date of Birth: 05-28-1991  Date:  05/02/2020  Clinical Social Worker Initiating Note:  Durward Fortes, LCSW Date/Time: Initiated:  05/02/20/1035     Child's Name:  Danielle Miller   Biological Parents:  Mother, Father Nyrah, Demos)   Need for Interpreter:  None   Reason for Referral:  Current Substance Use/Substance Use During Pregnancy , Behavioral Health Concerns   Address:  [place] [address]    Phone number:  [phone] (home)     Additional phone number: none   Household Members/Support Persons (HM/SP):   Household Member/Support Person 1   HM/SP Name Relationship DOB or Age  HM/SP -1 [place] MOB     HM/SP -2   Nolon Rod  daughter   31 years old   HM/SP -3        HM/SP -4        HM/SP -5        HM/SP -6        HM/SP -7        HM/SP -8          Natural Supports (not living in the home):  Parent   Professional Supports: None   Employment: Unemployed   Type of Work: layed off due to pandemic   Education:  High school graduate   Homebound arranged:  n/a  Museum/gallery curator Resources:  Medicaid   Other Resources:  Physicist, medical , [place] Considerations Which May Impact Care:  none reported.   Strengths:  Ability to meet basic needs , Compliance with medical plan , Home prepared for child , Pediatrician chosen   Psychotropic Medications:     None reported.     Pediatrician:    [place] area  Pediatrician List:   [facility]  [facility]      Pediatrician Fax Number:    Risk Factors/Current Problems:  None   Cognitive State:  Able to Concentrate , Insightful , Alert    Mood/Affect:  Calm , Relaxed , Comfortable , Interested , Happy    CSW Assessment: CSW consulted as MOB  reported THC use in pregancy as well as has a hx of anxiety and depression.   CSW called into MOB's room to speak with her. CSW advised MOB of CSW's role after congratulating her. CSW asked for permission to speak with MOB via phone as well as asked MOB to confirm her birthday to ensure that CSW was speaking with the correct person. MOB was agreeable to all things asked. MOB reported to this CSW that she was diagnosed with anxiety in depression around the age of 40-16. MOB reported no medication use but does report that she was in therapy prior to the pandemic. MOB reported "virtula sessions arent really helpful for me, they just don't work well". CSW understanding and offered MOB therapy resources, in which MOB was agreeable to. MOB reported no desire to be started on any medication for her anxiety and depression. MOB advised CSW that her anxiety and depression has always been a factor for her but more so prior to her pregnancy. MOB reported that during her pregnancy she dealt with anxiety and depression due to "the loss of family members and being laid off. MOB reported that she also had anxiety as  it related to MOB's diabetes. MOB reported worries that were around her and infatns health. MOB reported that since she has given birth she has had decrease in anxiety as "I know that she is okay". CSW validated feelings of anxiety at times especially as they relate to health. 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 MOB reported that things she dealt with in PPD was feelings of SI nut never having a plan, feeling irritable, and not eating much at all. MOB reported that she lost weight after the birth of her oldest daughter due to PPD. CSW understanding and offered MOB PPD and SID education. MOB Was given PPD Checklist in order to keep track of her feelings as they relate to PPD. MOB reported that he supports at this time are her mom .   CSW inquired from [facility] on her THC use.  MOB reported that she used earlier in her pregnancy as she was unable to eat and felt nauseous. MOB also reported that she used to "help" her anxiety. CSW understanding and advised MOB of the hospital drug screen policy. MOB was advised that if infants CDS returns back positive then CSW would need to make a CPS report. MOB reported that she understood and reported that she also took Hydrocodone for a pain, but that was given to her by her MD. CSW understanding and again reiterated hospital drug screen policy to [facility] and the result of positive screen. MOB reported no prior CPS hx and reports she took no other substances.   MOB reported that she has all needed items to care for infant including follow up care to [facility]. CSW will continue to follow CDS and make CPS report if warranted.   CSW Plan/Description:  No Further Intervention Required/No Barriers to Discharge, Sudden Infant Death Syndrome (SIDS) Education, Perinatal Mood and Anxiety Disorder (PMADs) Education, CSW Will Continue to Monitor Umbilical Cord Tissue Drug Screen Results and Make Report if Warranted, [place], [place] 05/02/2020, 10:56 AM

## 2020-05-03 MED ORDER — OXYCODONE HCL 5 MG PO TABS: 5.0000 mg | ORAL_TABLET | Freq: Four times a day (QID) | ORAL | 0 refills | Status: DC | PRN

## 2020-05-03 MED ORDER — IBUPROFEN 800 MG PO TABS: 800.0000 mg | ORAL_TABLET | Freq: Three times a day (TID) | ORAL | 1 refills | Status: AC | PRN

## 2020-05-03 MED ORDER — DOCUSATE SODIUM 100 MG PO CAPS: 100.0000 mg | ORAL_CAPSULE | Freq: Two times a day (BID) | ORAL | 1 refills | Status: AC | PRN

## 2020-05-03 NOTE — Lactation Note (Signed)
This note was copied from a baby's chart. Lactation Consultation Note Attempted to see mom. RN stated she was sleeping. RN will call [place] for assistance when needed.  Patient Name: Danielle Miller HYIFO'Y Date: 05/03/2020     Maternal Data    Feeding Feeding Type: Bottle Fed - Formula Nipple Type: Slow - flow  LATCH Score Latch:  (encouraged mom to call for latch assistance)                 Interventions    Lactation Tools Discussed/Used     Consult Status      [employer] G 05/03/2020, 2:54 AM

## 2020-05-03 NOTE — Progress Notes (Signed)
POSTPARTUM POSTOP PROGRESS NOTE  POD #2  Subjective:  No acute events overnight.  Pt denies problems with ambulating, voiding or po intake.  She denies nausea or vomiting.  Pain is well controlled.  She has had flatus. She has not had bowel movement.  Lochia Minimal. Amenable to 2hr GTT at postpartum visit  Objective: Blood pressure 121/82, pulse 82, temperature 98.4 F (36.9 C), temperature source Oral, resp. rate 16, height 5\' 7"  (1.702 m), weight 98 kg, last menstrual period 08/01/2019, SpO2 100 %, unknown if currently breastfeeding.  Physical Exam:  General: alert, cooperative and no distress Lochia:normal flow Chest: CTAB Heart: RRR no m/r/g Abdomen: +BS, soft, nontender Uterine Fundus: firm, 2cm below umbilicus. Honeycomb dressing intact, neg drainage Extremities: neg edema, neg calf TTP BL, neg Homans BL  Recent Labs    05/02/20 0544  HGB 9.7*  HCT 29.4*    Assessment/Plan:  ASSESSMENT: Josslin Sanjuan is a 29 y.o. [id number] s/p RLTCS @ [redacted]w[redacted]d for h/o csx1. PNC c/b GDMA2 on metformin.   Discharge home. 2hr GTT at postpartum given GDMA2. Iron/colace for anemia, asymptomatic at this time   LOS: 2 days

## 2020-05-03 NOTE — Discharge Summary (Signed)
Postpartum Discharge Summary  Date of Service updated     Patient Name: Danielle Miller DOB: 11-11-1991 MRN: [id number]  Date of admission: 05/01/2020 Delivery date:05/01/2020  Delivering provider: Willis Modena, TODD  Date of discharge: 05/03/2020  Admitting diagnosis: Maternal care due to low transverse uterine scar from previous cesarean delivery [O34.211] S/P cesarean section [Z98.891] Intrauterine pregnancy: [redacted]w[redacted]d    Secondary diagnosis:  Active Problems:   Maternal care due to low transverse uterine scar from previous cesarean delivery   S/P cesarean section  Additional problems: GDMA2    Discharge diagnosis: Term Pregnancy Delivered                                              Post partum procedures:none Complications: None  Hospital course: Sceduled C/S   29y.o. yo G2P2002 at 330w1das admitted to the hospital 05/01/2020 for scheduled cesarean section with the following indication:Elective Repeat and w/ h/o macrosomic infant.Delivery details are as follows:  Membrane Rupture Time/Date: 7:58 AM ,05/01/2020   Delivery Method:C-Section, Vacuum Assisted  Details of operation can be found in separate operative note.  Patient had an uncomplicated postpartum course.  She is ambulating, tolerating a regular diet, passing flatus, and urinating well. Patient is discharged home in stable condition on  05/03/20        Of note, patient declined COVID screening on admission and was therefore treated as presumptive positive during admission but was asymptomatic the whole time.  Newborn Data: Birth date:05/01/2020  Birth time:7:59 AM  Gender:Female  Living status:Living  Apgars:8 ,9  Weight:3220 g     Magnesium Sulfate received: No BMZ received: No Rhophylac:No MMR:No T-DaP:Given prenatally Flu: Yes Transfusion:No  Physical exam  Vitals:   05/02/20 1420 05/02/20 1921 05/02/20 2101 05/03/20 0551  BP: 104/70 122/73  121/82  Pulse: 76 (!) 104 99 82  Resp: _0 Temp: 98.1  F (36.7 C) 97.8 F (36.6 C)  98.4 F (36.9 C)  TempSrc: Oral Oral  Oral  SpO2: 100% 100%  100%  Weight:      Height:       General: alert, cooperative and no distress Lochia: appropriate Uterine Fundus: firm Incision: Healing well with no significant drainage, No significant erythema, Dressing is clean, dry, and intact DVT Evaluation: No evidence of DVT seen on physical exam. Negative Homan's sign. No cords or calf tenderness. Labs: Lab Results  Component Value Date   WBC 12.7 (H) 05/02/2020   HGB 9.7 (L) 05/02/2020   HCT 29.4 (L) 05/02/2020   MCV 83.8 05/02/2020   PLT 175 05/02/2020   CMP Latest Ref Rng & Units 05/01/2020  Glucose 70 - 99 mg/dL 101(H)  BUN 6 - 20 mg/dL 6  Creatinine 0.44 - 1.00 mg/dL 0.66  Sodium 135 - 145 mmol/L 134(L)  Potassium 3.5 - 5.1 mmol/L 3.9  Chloride 98 - 111 mmol/L 104  CO2 22 - 32 mmol/L 20(L)  Calcium 8.9 - 10.3 mg/dL 9.3  Total Protein 6.5 - 8.1 g/dL 6.7  Total Bilirubin 0.3 - 1.2 mg/dL 0.6  Alkaline Phos 38 - 126 U/L 110  AST 15 - 41 U/L 31  ALT 0 - 44 U/L 31   Edinburgh Score: Edinburgh Postnatal Depression Scale Screening Tool 05/02/2020  I have been able to laugh and see the funny side of things. 0  I  have looked forward with enjoyment to things. 1  I have blamed myself unnecessarily when things went wrong. 0  I have been anxious or worried for no good reason. 1  I have felt scared or panicky for no good reason. 1  Things have been getting on top of me. 1  I have been so unhappy that I have had difficulty sleeping. 0  I have felt sad or miserable. 0  I have been so unhappy that I have been crying. 1  The thought of harming myself has occurred to me. 0  Edinburgh Postnatal Depression Scale Total 5      After visit meds:  Allergies as of 05/03/2020      Reactions   Sulfa Antibiotics Other (See Comments)   Swelling in the eyes       Medication List    STOP taking these medications   metFORMIN 500 MG tablet Commonly  known as: GLUCOPHAGE     TAKE these medications   docusate sodium 100 MG capsule Commonly known as: Colace Take 1 capsule (100 mg total) by mouth 2 (two) times daily as needed for mild constipation.   ferrous sulfate 325 (65 FE) MG tablet Take 325 mg by mouth daily with breakfast.   ibuprofen 800 MG tablet Commonly known as: ADVIL Take 1 tablet (800 mg total) by mouth every 8 (eight) hours as needed.   oxyCODONE 5 MG immediate release tablet Commonly known as: Oxy IR/ROXICODONE Take 1 tablet (5 mg total) by mouth every 6 (six) hours as needed for moderate pain or severe pain.   prenatal multivitamin Tabs tablet Take 1 tablet by mouth daily at 12 noon.            Discharge Care Instructions  (From admission, onward)         Start     Ordered   05/03/20 0000  Leave dressing on - Keep it clean, dry, and intact until clinic visit        05/03/20 0928           Discharge home in stable condition Infant Feeding: Breast Infant Disposition:home with mother Discharge instruction: per After Visit Summary and Postpartum booklet. Activity: Advance as tolerated. Pelvic rest for 6 weeks.  Diet: carb modified diet Anticipated Birth Control: Unsure Postpartum Appointment:6 weeks Additional Postpartum F/U: Incision check 2wks Future Appointments:No future appointments. Follow up Visit:      05/03/2020 Deliah Boston, MD

## 2020-05-03 NOTE — Lactation Note (Signed)
This note was copied from a baby's chart. Lactation Consultation Note  Patient Name: Danielle Miller [id number] Date: 05/03/2020     Floor secretary called room to see if Ms. Nadeau would like to be seen by lactation prior to discharge. She declined lactation.   Consult Status Consult Status: Complete    Lenore Manner 05/03/2020, 11:04 AM

## 2020-05-03 NOTE — Lactation Note (Deleted)
This note was copied from a baby's chart. Lactation Consultation Note F/U w/mom to see how she was doing. Mom is pumping and bottle feeding. Mom stated still not getting anything when she pumps.  Patient Name: Danielle Miller [id number] Date: 05/03/2020     Maternal Data    Feeding Feeding Type: Bottle Fed - Formula Nipple Type: Slow - flow  LATCH Score Latch:  (encouraged mom to call for latch assistance)                 Interventions    Lactation Tools Discussed/Used     Consult Status      Harun Brumley G 05/03/2020, 2:52 AM

## 2021-04-02 ENCOUNTER — Ambulatory Visit (INDEPENDENT_AMBULATORY_CARE_PROVIDER_SITE_OTHER): Payer: Medicaid Other | Admitting: Orthopaedic Surgery

## 2021-04-02 DIAGNOSIS — G5603 Carpal tunnel syndrome, bilateral upper limbs: Secondary | ICD-10-CM

## 2021-04-02 NOTE — Progress Notes (Signed)
Office Visit Note   Patient: Danielle Miller           Date of Birth: 1990-12-30           MRN: [id number] Visit Date: 04/02/2021              Requested by: No referring provider defined for this encounter. PCP: Patient, No Pcp Per (Inactive)   Assessment & Plan: Visit Diagnoses:  1. Bilateral carpal tunnel syndrome     Plan: Impression is bilateral carpal tunnel syndrome right greater than left.  At this point, we will refer her out for nerve conduction study/EMG bilateral upper extremities.  She will follow-up with [place] once this has been completed.  In the meantime, she would like a work note to be out for the next 2 weeks as she performs a significant amount of lifting, pushing, pulling at work which severely increases her symptoms.  Follow-Up Instructions: Return for after NCS/EMG.   Orders:  No orders of the defined types were placed in this encounter.  No orders of the defined types were placed in this encounter.     Procedures: No procedures performed   Clinical Data: No additional findings.   Subjective: Chief Complaint  Patient presents with  . Other     Bilat hand pain and numbness R>L    HPI patient is a very pleasant 30 year old right-hand-dominant female who comes in today with bilateral hand paresthesias right greater than left.  She is 11 months postpartum and notes that her symptoms began prior to pregnancy but worsened during the course of pregnancy.  Her symptoms have in fact worsen even during the postpartum period.  She notes paresthesias to all fingers of both hands except for the thumbs.  She has recently noticed weakness to the right hand.  Symptoms are worse at night when she is sleeping as well as when she is driving.  She has tried nighttime braces which minimally helped.  She does note that she works at [employer] and lifting heavy boxes.  Review of Systems As detailed in HPI.  All others reviewed and are  negative.  Objective: Vital Signs: There were no vitals taken for this visit.  Physical Exam well-developed well-nourished female no acute distress.  Alert and oriented x3.  Ortho Exam bilateral hand exam shows a positive Phalen and Tinel both sides.  No thenar atrophy.  She is neurovascular intact distally.  Specialty Comments:  No specialty comments available.  Imaging: No new imaging   PMFS History: Patient Active Problem List   Diagnosis Date Noted  . Maternal care due to low transverse uterine scar from previous cesarean delivery 05/01/2020  . S/P cesarean section 05/01/2020  . Diet controlled gestational diabetes mellitus (GDM) in third trimester 03/02/2020   Past Medical History:  Diagnosis Date  . Anemia   . Anxiety   . Asthma   . Depression    doing ok  . Fibroid   . Gestational diabetes     Family History  Problem Relation Age of Onset  . Diabetes Mother   . Hypertension Mother   . Healthy Father     Past Surgical History:  Procedure Laterality Date  . CESAREAN SECTION    . CESAREAN SECTION N/A 05/01/2020   Procedure: CESAREAN SECTION;  Surgeon: Cheri Fowler, MD;  Location: [facility] LD ORS;  Service: Obstetrics;  Laterality: N/A;  Tracey RNFA   Social History   Occupational History  . Not on file  Tobacco Use  .  Smoking status: Former Smoker    Packs/day: 0.25    Types: Cigarettes  . Smokeless tobacco: Never Used  . Tobacco comment: quit in first trimester  Vaping Use  . Vaping Use: Never used  Substance and Sexual Activity  . Alcohol use: Not Currently    Comment: socially   . Drug use: Not Currently    Types: Marijuana    Comment: 1st trimester  . Sexual activity: Yes    Birth control/protection: None

## 2021-04-09 ENCOUNTER — Encounter: Payer: Self-pay | Admitting: Physical Medicine and Rehabilitation

## 2021-04-09 ENCOUNTER — Ambulatory Visit (INDEPENDENT_AMBULATORY_CARE_PROVIDER_SITE_OTHER): Payer: Medicaid Other | Admitting: Physical Medicine and Rehabilitation

## 2021-04-09 ENCOUNTER — Other Ambulatory Visit: Payer: Self-pay

## 2021-04-09 DIAGNOSIS — R202 Paresthesia of skin: Secondary | ICD-10-CM

## 2021-04-09 NOTE — Procedures (Signed)
EMG & NCV Findings: Evaluation of the left median motor nerve showed prolonged distal onset latency (7.3 ms).  The right median motor nerve showed prolonged distal onset latency (7.7 ms), reduced amplitude (3.4 mV), and decreased conduction velocity (Elbow-Wrist, 45 m/s).  The left median (across palm) sensory nerve showed no response (Palm), prolonged distal peak latency (6.3 ms), and reduced amplitude (5.0 V).  The right median (across palm) sensory nerve showed prolonged distal peak latency (Wrist, 6.9 ms), reduced amplitude (3.7 V), and prolonged distal peak latency (Palm, 4.1 ms).  All remaining nerves (as indicated in the following tables) were within normal limits.  Left vs. Right side comparison data for the median motor nerve indicates abnormal L-R amplitude difference (61.8 %).  All remaining left vs. right side differences were within normal limits.    All examined muscles (as indicated in the following table) showed no evidence of electrical instability.    Impression: The above electrodiagnostic study is ABNORMAL and reveals evidence of a severe Bilateral Right worse than Left median nerve entrapment at the wrist (carpal tunnel syndrome) affecting sensory and motor components.   There is no significant electrodiagnostic evidence of any other focal nerve entrapment, brachial plexopathy or cervical radiculopathy.   Recommendations: 1.  Follow-up with referring physician. 2.  Continue current management of symptoms. 3.  Suggest surgical evaluation.  ___________________________ Laurence Spates FAAPMR Board Certified, American Board of Physical Medicine and Rehabilitation    Nerve Conduction Studies Anti Sensory Summary Table   Stim Site NR Peak (ms) Norm Peak (ms) P-T Amp (V) Norm P-T Amp Site1 Site2 Delta-P (ms) Dist (cm) Vel (m/s) Norm Vel (m/s)  Left Median Acr Palm Anti Sensory (2nd Digit)  31C  Wrist    *6.3 <3.6 *5.0 >10 Wrist Palm  0.0    Palm *NR  <2.0          Right  Median Acr Palm Anti Sensory (2nd Digit)  31.4C  Wrist    *6.9 <3.6 *3.7 >10 Wrist Palm 2.8 0.0    Palm    *4.1 <2.0 4.4         Left Radial Anti Sensory (Base 1st Digit)  31C  Wrist    1.7 <3.1 30.0  Wrist Base 1st Digit 1.7 0.0    Right Radial Anti Sensory (Base 1st Digit)  30.5C  Wrist    2.0 <3.1 38.4  Wrist Base 1st Digit 2.0 0.0    Left Ulnar Anti Sensory (5th Digit)  31.3C  Wrist    3.0 <3.7 21.2 >15.0 Wrist 5th Digit 3.0 14.0 47 >38  Right Ulnar Anti Sensory (5th Digit)  31.3C  Wrist    3.0 <3.7 26.7 >15.0 Wrist 5th Digit 3.0 14.0 47 >38   Motor Summary Table   Stim Site NR Onset (ms) Norm Onset (ms) O-P Amp (mV) Norm O-P Amp Site1 Site2 Delta-0 (ms) Dist (cm) Vel (m/s) Norm Vel (m/s)  Left Median Motor (Abd Poll Brev)  30.8C  Wrist    *7.3 <4.2 8.9 >5 Elbow Wrist 4.4 22.5 51 >50  Elbow    11.7  8.6         Right Median Motor (Abd Poll Brev)  30.7C  Wrist    *7.7 <4.2 *3.4 >5 Elbow Wrist 4.9 22.0 *45 >50  Elbow    12.6  3.1         Left Ulnar Motor (Abd Dig Min)  30.8C  Wrist    2.6 <4.2 9.8 >3 B Elbow Wrist 3.6 22.0  61 >53  B Elbow    6.2  9.3  A Elbow B Elbow 1.2 10.0 83 >53  A Elbow    7.4  9.3         Right Ulnar Motor (Abd Dig Min)  30.9C  Wrist    2.8 <4.2 10.7 >3 B Elbow Wrist 3.7 22.0 59 >53  B Elbow    6.5  10.8  A Elbow B Elbow 1.5 10.0 67 >53  A Elbow    8.0  10.7          EMG   Side Muscle Nerve Root Ins Act Fibs Psw Amp Dur Poly Recrt Int Fraser Din Comment  Right Abd Poll Brev Median C8-T1 Nml Nml Nml Nml Nml 0 Nml Nml   Right 1stDorInt Ulnar C8-T1 Nml Nml Nml Nml Nml 0 Nml Nml     Nerve Conduction Studies Anti Sensory Left/Right Comparison   Stim Site L Lat (ms) R Lat (ms) L-R Lat (ms) L Amp (V) R Amp (V) L-R Amp (%) Site1 Site2 L Vel (m/s) R Vel (m/s) L-R Vel (m/s)  Median Acr Palm Anti Sensory (2nd Digit)  31C  Wrist *6.3 *6.9 0.6 *5.0 *3.7 26.0 Wrist Palm     Palm  *4.1   4.4        Radial Anti Sensory (Base 1st Digit)  31C  Wrist 1.7 2.0  0.3 30.0 38.4 21.9 Wrist Base 1st Digit     Ulnar Anti Sensory (5th Digit)  31.3C  Wrist 3.0 3.0 0.0 21.2 26.7 20.6 Wrist 5th Digit 47 47 0   Motor Left/Right Comparison   Stim Site L Lat (ms) R Lat (ms) L-R Lat (ms) L Amp (mV) R Amp (mV) L-R Amp (%) Site1 Site2 L Vel (m/s) R Vel (m/s) L-R Vel (m/s)  Median Motor (Abd Poll Brev)  30.8C  Wrist *7.3 *7.7 0.4 8.9 *3.4 *61.8 Elbow Wrist 51 *45 6  Elbow 11.7 12.6 0.9 8.6 3.1 64.0       Ulnar Motor (Abd Dig Min)  30.8C  Wrist 2.6 2.8 0.2 9.8 10.7 8.4 B Elbow Wrist 61 59 2  B Elbow 6.2 6.5 0.3 9.3 10.8 13.9 A Elbow B Elbow 83 67 16  A Elbow 7.4 8.0 0.6 9.3 10.7 13.1          Waveforms:                  2

## 2021-04-09 NOTE — Progress Notes (Signed)
Numbness and tingling in both hands. Thumbs do not have as much numbness. Symptoms worse at night. Right hand dominant No lotion per patient Numeric Pain Rating Scale and Functional Assessment Average Pain 9   In the last MONTH (on 0-10 scale) has pain interfered with the following?  1. General activity like being  able to carry out your everyday physical activities such as walking, climbing stairs, carrying groceries, or moving a chair?  Rating(9)

## 2021-04-09 NOTE — Progress Notes (Signed)
Danielle Miller - 30 y.o. female MRN [id number]  Date of birth: 03/30/1991  Office Visit Note: Visit Date: 04/09/2021 PCP: Patient, No Pcp Per (Inactive) Referred by: Aundra Dubin, PA-C  Subjective: Chief Complaint  Patient presents with  . Right Hand - Numbness  . Left Hand - Numbness   HPI:  Danielle Miller is a 30 y.o. female who comes in today at the request of Dr. Eduard Roux for electrodiagnostic study of the Bilateral upper extremities.  Patient is Right hand dominant.  She reports approximately 1 year of worsening symptoms into both hands right more than left but bilateral pain numbness and tingling in a global fashion.  She reports worsening symptoms during pregnancy.  She did have symptoms before that.  She does work at [employer] and does a lot of use of her hands.  Her hands become more numb at night with nocturnal complaints also with driving in different positions.  She has failed medication management and wrist splints.  No prior electrodiagnostic studies.  She did have some history of gestational diabetes but does not have diabetes in general.  No frank radicular symptoms.   ROS Otherwise per HPI.  Assessment & Plan: Visit Diagnoses:    ICD-10-CM   1. Paresthesia of skin  R20.2 NCV with EMG (electromyography)    Plan: Impression: The above electrodiagnostic study is ABNORMAL and reveals evidence of a severe Bilateral Right worse than Left median nerve entrapment at the wrist (carpal tunnel syndrome) affecting sensory and motor components.   There is no significant electrodiagnostic evidence of any other focal nerve entrapment, brachial plexopathy or cervical radiculopathy.   Recommendations: 1.  Follow-up with referring physician. 2.  Continue current management of symptoms. 3.  Suggest surgical evaluation.  Meds & Orders: No orders of the defined types were placed in this encounter.   Orders Placed This Encounter  Procedures  . NCV with EMG  (electromyography)    Follow-up: Return in about 2 weeks (around 04/23/2021) for  Eduard Roux, MD.   Procedures: No procedures performed  EMG & NCV Findings: Evaluation of the left median motor nerve showed prolonged distal onset latency (7.3 ms).  The right median motor nerve showed prolonged distal onset latency (7.7 ms), reduced amplitude (3.4 mV), and decreased conduction velocity (Elbow-Wrist, 45 m/s).  The left median (across palm) sensory nerve showed no response (Palm), prolonged distal peak latency (6.3 ms), and reduced amplitude (5.0 V).  The right median (across palm) sensory nerve showed prolonged distal peak latency (Wrist, 6.9 ms), reduced amplitude (3.7 V), and prolonged distal peak latency (Palm, 4.1 ms).  All remaining nerves (as indicated in the following tables) were within normal limits.  Left vs. Right side comparison data for the median motor nerve indicates abnormal L-R amplitude difference (61.8 %).  All remaining left vs. right side differences were within normal limits.    All examined muscles (as indicated in the following table) showed no evidence of electrical instability.    Impression: The above electrodiagnostic study is ABNORMAL and reveals evidence of a severe Bilateral Right worse than Left median nerve entrapment at the wrist (carpal tunnel syndrome) affecting sensory and motor components.   There is no significant electrodiagnostic evidence of any other focal nerve entrapment, brachial plexopathy or cervical radiculopathy.   Recommendations: 1.  Follow-up with referring physician. 2.  Continue current management of symptoms. 3.  Suggest surgical evaluation.  ___________________________ Wonda Olds Board Certified, American Board of Physical Medicine and Rehabilitation  Nerve Conduction Studies Anti Sensory Summary Table   Stim Site NR Peak (ms) Norm Peak (ms) P-T Amp (V) Norm P-T Amp Site1 Site2 Delta-P (ms) Dist (cm) Vel (m/s) Norm Vel  (m/s)  Left Median Acr Palm Anti Sensory (2nd Digit)  31C  Wrist    *6.3 <3.6 *5.0 >10 Wrist Palm  0.0    Palm *NR  <2.0          Right Median Acr Palm Anti Sensory (2nd Digit)  31.4C  Wrist    *6.9 <3.6 *3.7 >10 Wrist Palm 2.8 0.0    Palm    *4.1 <2.0 4.4         Left Radial Anti Sensory (Base 1st Digit)  31C  Wrist    1.7 <3.1 30.0  Wrist Base 1st Digit 1.7 0.0    Right Radial Anti Sensory (Base 1st Digit)  30.5C  Wrist    2.0 <3.1 38.4  Wrist Base 1st Digit 2.0 0.0    Left Ulnar Anti Sensory (5th Digit)  31.3C  Wrist    3.0 <3.7 21.2 >15.0 Wrist 5th Digit 3.0 14.0 47 >38  Right Ulnar Anti Sensory (5th Digit)  31.3C  Wrist    3.0 <3.7 26.7 >15.0 Wrist 5th Digit 3.0 14.0 47 >38   Motor Summary Table   Stim Site NR Onset (ms) Norm Onset (ms) O-P Amp (mV) Norm O-P Amp Site1 Site2 Delta-0 (ms) Dist (cm) Vel (m/s) Norm Vel (m/s)  Left Median Motor (Abd Poll Brev)  30.8C  Wrist    *7.3 <4.2 8.9 >5 Elbow Wrist 4.4 22.5 51 >50  Elbow    11.7  8.6         Right Median Motor (Abd Poll Brev)  30.7C  Wrist    *7.7 <4.2 *3.4 >5 Elbow Wrist 4.9 22.0 *45 >50  Elbow    12.6  3.1         Left Ulnar Motor (Abd Dig Min)  30.8C  Wrist    2.6 <4.2 9.8 >3 B Elbow Wrist 3.6 22.0 61 >53  B Elbow    6.2  9.3  A Elbow B Elbow 1.2 10.0 83 >53  A Elbow    7.4  9.3         Right Ulnar Motor (Abd Dig Min)  30.9C  Wrist    2.8 <4.2 10.7 >3 B Elbow Wrist 3.7 22.0 59 >53  B Elbow    6.5  10.8  A Elbow B Elbow 1.5 10.0 67 >53  A Elbow    8.0  10.7          EMG   Side Muscle Nerve Root Ins Act Fibs Psw Amp Dur Poly Recrt Int Fraser Din Comment  Right Abd Poll Brev Median C8-T1 Nml Nml Nml Nml Nml 0 Nml Nml   Right 1stDorInt Ulnar C8-T1 Nml Nml Nml Nml Nml 0 Nml Nml     Nerve Conduction Studies Anti Sensory Left/Right Comparison   Stim Site L Lat (ms) R Lat (ms) L-R Lat (ms) L Amp (V) R Amp (V) L-R Amp (%) Site1 Site2 L Vel (m/s) R Vel (m/s) L-R Vel (m/s)  Median Acr Palm Anti Sensory (2nd Digit)   31C  Wrist *6.3 *6.9 0.6 *5.0 *3.7 26.0 Wrist Palm     Palm  *4.1   4.4        Radial Anti Sensory (Base 1st Digit)  31C  Wrist 1.7 2.0 0.3 30.0 38.4 21.9 Wrist Base 1st Digit  Ulnar Anti Sensory (5th Digit)  31.3C  Wrist 3.0 3.0 0.0 21.2 26.7 20.6 Wrist 5th Digit 47 47 0   Motor Left/Right Comparison   Stim Site L Lat (ms) R Lat (ms) L-R Lat (ms) L Amp (mV) R Amp (mV) L-R Amp (%) Site1 Site2 L Vel (m/s) R Vel (m/s) L-R Vel (m/s)  Median Motor (Abd Poll Brev)  30.8C  Wrist *7.3 *7.7 0.4 8.9 *3.4 *61.8 Elbow Wrist 51 *45 6  Elbow 11.7 12.6 0.9 8.6 3.1 64.0       Ulnar Motor (Abd Dig Min)  30.8C  Wrist 2.6 2.8 0.2 9.8 10.7 8.4 B Elbow Wrist 61 59 2  B Elbow 6.2 6.5 0.3 9.3 10.8 13.9 A Elbow B Elbow 83 67 16  A Elbow 7.4 8.0 0.6 9.3 10.7 13.1          Waveforms:                  2    Clinical History: No specialty comments available.     Objective:  VS:  HT:    WT:   BMI:     BP:   HR: bpm  TEMP: ( )  RESP:  Physical Exam Musculoskeletal:        General: No swelling, tenderness or deformity.     Comments: Inspection reveals flattening of the right APB compared to the left but no atrophy of the bilateral FDI or hand intrinsics. There is no swelling, color changes, allodynia or dystrophic changes. There is 5 out of 5 strength in the bilateral wrist extension, finger abduction and long finger flexion.  There is decreased sensation to light touch in the bilateral median nerve distribution.  There is a positive Phalen's test bilaterally. There is a negative Hoffmann's test bilaterally.  Skin:    General: Skin is warm and dry.     Findings: No erythema or rash.  Neurological:     General: No focal deficit present.     Mental Status: She is alert and oriented to person, place, and time.     Motor: No weakness or abnormal muscle tone.     Coordination: Coordination normal.  Psychiatric:        Mood and Affect: Mood normal.        Behavior: Behavior normal.       Imaging: No results found.

## 2021-04-10 ENCOUNTER — Telehealth: Payer: Self-pay

## 2021-04-10 NOTE — Telephone Encounter (Signed)
She was supposed to be out of work for just 2 weeks from the last clinic visit.

## 2021-04-10 NOTE — Telephone Encounter (Signed)
Pt called asking if she is to stay out of work till she gets her results back from the Nerve test or does she return back to work? She would like to keep her job updated.

## 2021-04-12 NOTE — Telephone Encounter (Signed)
Note ready for pick up

## 2021-04-23 ENCOUNTER — Ambulatory Visit (INDEPENDENT_AMBULATORY_CARE_PROVIDER_SITE_OTHER): Payer: Medicaid Other | Admitting: Orthopaedic Surgery

## 2021-04-23 ENCOUNTER — Encounter: Payer: Self-pay | Admitting: Orthopaedic Surgery

## 2021-04-23 DIAGNOSIS — G5601 Carpal tunnel syndrome, right upper limb: Secondary | ICD-10-CM | POA: Diagnosis not present

## 2021-04-23 NOTE — Progress Notes (Signed)
   Office Visit Note   Patient: Danielle Miller           Date of Birth: 03-Dec-1990           MRN: [id number] Visit Date: 04/23/2021              Requested by: No referring provider defined for this encounter. PCP: Patient, No Pcp Per (Inactive)   Assessment & Plan: Visit Diagnoses:  1. Right carpal tunnel syndrome     Plan: Nerve conduction studies reveal severe carpal tunnel syndrome on both sides worse on the right.  These were reviewed with the patient in detail and recommendation has been made for surgical release.  Details of the surgery including risk benefits rehab recovery time out of work all reviewed.  Anticipate to 4 weeks out of work for the surgery for each hand.  Questions encouraged and answered.  She met with Jackelyn Poling today to schedule surgery.  Follow-Up Instructions: Return for postop.   Orders:  No orders of the defined types were placed in this encounter.  No orders of the defined types were placed in this encounter.     Procedures: No procedures performed   Clinical Data: No additional findings.   Subjective: Chief Complaint  Patient presents with  . Right Hand - Pain  . Left Hand - Pain    Danielle Miller returns today for review of recent nerve conduction studies which showed severe bilateral carpal tunnel syndrome worse on the right.   Review of Systems   Objective: Vital Signs: There were no vitals taken for this visit.  Physical Exam  Ortho Exam Bilateral hand exams are unchanged. Specialty Comments:  No specialty comments available.  Imaging: No results found.   PMFS History: Patient Active Problem List   Diagnosis Date Noted  . Right carpal tunnel syndrome 04/23/2021  . Maternal care due to low transverse uterine scar from previous cesarean delivery 05/01/2020  . S/P cesarean section 05/01/2020  . Diet controlled gestational diabetes mellitus (GDM) in third trimester 03/02/2020   Past Medical History:  Diagnosis Date  . Anemia    . Anxiety   . Asthma   . Depression    doing ok  . Fibroid   . Gestational diabetes     Family History  Problem Relation Age of Onset  . Diabetes Mother   . Hypertension Mother   . Healthy Father     Past Surgical History:  Procedure Laterality Date  . CESAREAN SECTION    . CESAREAN SECTION N/A 05/01/2020   Procedure: CESAREAN SECTION;  Surgeon: Cheri Fowler, MD;  Location: [facility];  Service: Obstetrics;  Laterality: N/A;  Tracey RNFA   Social History   Occupational History  . Not on file  Tobacco Use  . Smoking status: Former Smoker    Packs/day: 0.25    Types: Cigarettes  . Smokeless tobacco: Never Used  . Tobacco comment: quit in first trimester  Vaping Use  . Vaping Use: Never used  Substance and Sexual Activity  . Alcohol use: Not Currently    Comment: socially   . Drug use: Not Currently    Types: Marijuana    Comment: 1st trimester  . Sexual activity: Yes    Birth control/protection: None

## 2021-04-28 ENCOUNTER — Other Ambulatory Visit: Payer: Self-pay | Admitting: Physician Assistant

## 2021-04-28 MED ORDER — ONDANSETRON HCL 4 MG PO TABS: 4.0000 mg | ORAL_TABLET | Freq: Three times a day (TID) | ORAL | 0 refills | Status: DC | PRN

## 2021-04-28 MED ORDER — HYDROCODONE-ACETAMINOPHEN 5-325 MG PO TABS: 1.0000 | ORAL_TABLET | Freq: Three times a day (TID) | ORAL | 0 refills | Status: DC | PRN

## 2021-05-02 DIAGNOSIS — G5601 Carpal tunnel syndrome, right upper limb: Secondary | ICD-10-CM | POA: Diagnosis not present

## 2021-05-09 ENCOUNTER — Encounter: Payer: Self-pay | Admitting: Orthopaedic Surgery

## 2021-05-09 ENCOUNTER — Ambulatory Visit (INDEPENDENT_AMBULATORY_CARE_PROVIDER_SITE_OTHER): Payer: Medicaid Other | Admitting: Physician Assistant

## 2021-05-09 ENCOUNTER — Other Ambulatory Visit: Payer: Self-pay

## 2021-05-09 VITALS — Ht 67.0 in | Wt 216.0 lb

## 2021-05-09 DIAGNOSIS — G5601 Carpal tunnel syndrome, right upper limb: Secondary | ICD-10-CM

## 2021-05-09 NOTE — Progress Notes (Signed)
   Post-Op Visit Note   Patient: Danielle Miller           Date of Birth: 07/23/1991           MRN: [id number] Visit Date: 05/09/2021 PCP: Patient, No Pcp Per (Inactive)   Assessment & Plan:  Chief Complaint:  Chief Complaint  Patient presents with   Right Wrist - Follow-up    Right carpal tunnel release 05/02/2021   Visit Diagnoses:  1. Right carpal tunnel syndrome   2. Carpal tunnel syndrome, right upper limb     Plan: Patient is a pleasant 30 year old female who comes in today 1 week out right carpal tunnel release, date of surgery, 05/02/2021.  She has been doing well.  She still notes some paresthesias to the ring and long fingertips.  Examination of her right wrist reveals a well-healing surgical incision with nylon sutures in place.  No evidence of infection or cellulitis.  Fingers warm well perfused.  At this point, the wound was cleaned and recovered with a Band-Aid.  Removable splint provided for which she will wear for the next week.  No heavy lifting or submerging her hand in water for another week.  Follow-up with [place] next week for suture removal.  Call with concerns or questions in the meantime.  Follow-Up Instructions: Return in about 1 week (around 05/16/2021).   Orders:  No orders of the defined types were placed in this encounter.  No orders of the defined types were placed in this encounter.   Imaging: No new imaging  PMFS History: Patient Active Problem List   Diagnosis Date Noted   Right carpal tunnel syndrome 04/23/2021   Maternal care due to low transverse uterine scar from previous cesarean delivery 05/01/2020   S/P cesarean section 05/01/2020   Diet controlled gestational diabetes mellitus (GDM) in third trimester 03/02/2020   Past Medical History:  Diagnosis Date   Anemia    Anxiety    Asthma    Depression    doing ok   Fibroid    Gestational diabetes     Family History  Problem Relation Age of Onset   Diabetes Mother    Hypertension Mother     Healthy Father     Past Surgical History:  Procedure Laterality Date   CESAREAN SECTION     CESAREAN SECTION N/A 05/01/2020   Procedure: CESAREAN SECTION;  Surgeon: Cheri Fowler, MD;  Location: [facility];  Service: Obstetrics;  Laterality: N/A;  Tracey RNFA   Social History   Occupational History   Not on file  Tobacco Use   Smoking status: Former    Packs/day: 0.25    Pack years: 0.00    Types: Cigarettes   Smokeless tobacco: Never   Tobacco comments:    quit in first trimester  Vaping Use   Vaping Use: Never used  Substance and Sexual Activity   Alcohol use: Not Currently    Comment: socially    Drug use: Not Currently    Types: Marijuana    Comment: 1st trimester   Sexual activity: Yes    Birth control/protection: None

## 2021-05-16 ENCOUNTER — Ambulatory Visit (INDEPENDENT_AMBULATORY_CARE_PROVIDER_SITE_OTHER): Payer: Medicaid Other | Admitting: Orthopaedic Surgery

## 2021-05-16 ENCOUNTER — Encounter: Payer: Self-pay | Admitting: Orthopaedic Surgery

## 2021-05-16 VITALS — Ht 67.0 in | Wt 216.0 lb

## 2021-05-16 DIAGNOSIS — G5601 Carpal tunnel syndrome, right upper limb: Secondary | ICD-10-CM

## 2021-05-16 NOTE — Progress Notes (Signed)
   Post-Op Visit Note   Patient: Danielle Miller           Date of Birth: 1990-12-18           MRN: [id number] Visit Date: 05/16/2021 PCP: Patient, No Pcp Per (Inactive)   Assessment & Plan:  Chief Complaint:  Chief Complaint  Patient presents with   Right Wrist - Follow-up    Right carpal tunnel release 05/02/2021   Visit Diagnoses:  1. Right carpal tunnel syndrome     Plan: Danielle Miller is 2 weeks status post right carpal tunnel release on 05/02/2021.  She has already felt an improvement in her symptoms and is sleeping better at night.  She reports some residual numbness on the ulnar side of the long finger around both sides of the ring finger.  Surgical incision is healed.  No Norvasc compromise.  No signs of infection.  Today we remove the sutures.  She is to limit lifting to less than 10 pounds for another 2 weeks and then can increase as tolerated.  We did talk about the nature of her work and how this may cause flareups or incomplete resolution of her carpal tunnel syndrome.  I would like to recheck her in 4 weeks.  She will let us know when she is ready to have the left carpal tunnel release.  Follow-Up Instructions: Return in about 4 weeks (around 06/13/2021).   Orders:  No orders of the defined types were placed in this encounter.  No orders of the defined types were placed in this encounter.   Imaging: No results found.  PMFS History: Patient Active Problem List   Diagnosis Date Noted   Right carpal tunnel syndrome 04/23/2021   Maternal care due to low transverse uterine scar from previous cesarean delivery 05/01/2020   S/P cesarean section 05/01/2020   Diet controlled gestational diabetes mellitus (GDM) in third trimester 03/02/2020   Past Medical History:  Diagnosis Date   Anemia    Anxiety    Asthma    Depression    doing ok   Fibroid    Gestational diabetes     Family History  Problem Relation Age of Onset   Diabetes Mother    Hypertension Mother     Healthy Father     Past Surgical History:  Procedure Laterality Date   CESAREAN SECTION     CESAREAN SECTION N/A 05/01/2020   Procedure: CESAREAN SECTION;  Surgeon: Cheri Fowler, MD;  Location: [facility];  Service: Obstetrics;  Laterality: N/A;  Tracey RNFA   Social History   Occupational History   Not on file  Tobacco Use   Smoking status: Former    Packs/day: 0.25    Pack years: 0.00    Types: Cigarettes   Smokeless tobacco: Never   Tobacco comments:    quit in first trimester  Vaping Use   Vaping Use: Never used  Substance and Sexual Activity   Alcohol use: Not Currently    Comment: socially    Drug use: Not Currently    Types: Marijuana    Comment: 1st trimester   Sexual activity: Yes    Birth control/protection: None

## 2021-06-10 ENCOUNTER — Telehealth: Payer: Self-pay | Admitting: Orthopaedic Surgery

## 2021-06-10 NOTE — Telephone Encounter (Signed)
[place] [employer] forms received. To [employer]. °

## 2021-06-12 ENCOUNTER — Emergency Department (HOSPITAL_COMMUNITY)
Admission: EM | Admit: 2021-06-12 | Discharge: 2021-06-12 | Disposition: A | Discharge status: Home / Self Care | Payer: Medicaid Other | Attending: Emergency Medicine | Admitting: Emergency Medicine

## 2021-06-12 ENCOUNTER — Encounter (HOSPITAL_COMMUNITY): Payer: Self-pay

## 2021-06-12 ENCOUNTER — Other Ambulatory Visit: Payer: Self-pay

## 2021-06-12 DIAGNOSIS — J45909 Unspecified asthma, uncomplicated: Secondary | ICD-10-CM | POA: Insufficient documentation

## 2021-06-12 DIAGNOSIS — Z87891 Personal history of nicotine dependence: Secondary | ICD-10-CM | POA: Insufficient documentation

## 2021-06-12 DIAGNOSIS — J029 Acute pharyngitis, unspecified: Secondary | ICD-10-CM | POA: Diagnosis present

## 2021-06-12 DIAGNOSIS — H9202 Otalgia, left ear: Secondary | ICD-10-CM | POA: Diagnosis not present

## 2021-06-12 LAB — GROUP A STREP BY PCR: Group A Strep by PCR: NOT DETECTED

## 2021-06-12 MED ORDER — ACETAMINOPHEN 500 MG PO TABS: 1000.0000 mg | ORAL_TABLET | Freq: Four times a day (QID) | ORAL | 0 refills | Status: AC | PRN

## 2021-06-12 MED ORDER — LIDOCAINE VISCOUS HCL 2 % MT SOLN: 15.0000 mL | OROMUCOSAL | 0 refills | Status: AC | PRN

## 2021-06-12 NOTE — Discharge Instructions (Addendum)
Your strep test is negative.  However, your symptoms could be due to COVID infection.  If your symptoms worsen especially if you have shortness of breath please consider return to the ER for evaluation.  Take medication prescribed for symptom control

## 2021-06-12 NOTE — ED Triage Notes (Signed)
Pt complains of sore throat and bilateral ear pain for 2 days.

## 2021-06-12 NOTE — ED Provider Notes (Signed)
[place] DEPT Provider Note   CSN: [id number] Arrival date & time: 06/12/21  0455     History Chief Complaint  Patient presents with   Sore Throat   Otalgia    Danielle Miller is a 30 y.o. female.  The history is provided by the patient. No language interpreter was used.  Sore Throat  Otalgia Associated symptoms: sore throat   Associated symptoms: no fever and no rash    30 year old female who presents for evaluation of sore throat.  Patient report for the past 2 days she has been having sore throat and left ear pain.  Sore throat is more noticeable with talking and swallowing.  Left ear pain is moderate without any hearing changes.  Symptoms moderate in severity.  No associated fever chills cough shortness of breath loss of taste or smell nausea vomiting diarrhea.  She did try some Robitussin with some improvement.  She has been vaccinated for COVID-19.  She denies any recent sick contact.  No headache.  Past Medical History:  Diagnosis Date   Anemia    Anxiety    Asthma    Depression    doing ok   Fibroid    Gestational diabetes     Patient Active Problem List   Diagnosis Date Noted   Right carpal tunnel syndrome 04/23/2021   Maternal care due to low transverse uterine scar from previous cesarean delivery 05/01/2020   S/P cesarean section 05/01/2020   Diet controlled gestational diabetes mellitus (GDM) in third trimester 03/02/2020    Past Surgical History:  Procedure Laterality Date   CESAREAN SECTION     CESAREAN SECTION N/A 05/01/2020   Procedure: CESAREAN SECTION;  Surgeon: Cheri Fowler, MD;  Location: [facility];  Service: Obstetrics;  Laterality: N/A;  Tracey RNFA     OB History     Gravida  2   Para  2   Term  2   Preterm      AB      Living  2      SAB      IAB      Ectopic      Multiple  0   Live Births  2        Obstetric Comments  Failed to dilate, 9#15oz ([place])         Family  History  Problem Relation Age of Onset   Diabetes Mother    Hypertension Mother    Healthy Father     Social History   Tobacco Use   Smoking status: Former    Packs/day: 0.25    Types: Cigarettes   Smokeless tobacco: Never   Tobacco comments:    quit in first trimester  Vaping Use   Vaping Use: Never used  Substance Use Topics   Alcohol use: Not Currently    Comment: socially    Drug use: Not Currently    Types: Marijuana    Comment: 1st trimester    Home Medications Prior to Admission medications   Medication Sig Start Date End Date Taking? Authorizing Provider  docusate sodium (COLACE) 100 MG capsule Take 1 capsule (100 mg total) by mouth 2 (two) times daily as needed for mild constipation. 05/03/20   Shivaji, Melida Quitter, MD  ferrous sulfate 325 (65 FE) MG tablet Take 325 mg by mouth daily with breakfast.    [provider]  HYDROcodone-acetaminophen (NORCO) 5-325 MG tablet Take 1 tablet by mouth 3 (three) times daily as  needed. To be taken as needed after surgery 04/28/21   Aundra Dubin, PA-C  ibuprofen (ADVIL) 800 MG tablet Take 1 tablet (800 mg total) by mouth every 8 (eight) hours as needed. 05/03/20   Shivaji, Melida Quitter, MD  ondansetron (ZOFRAN) 4 MG tablet Take 1 tablet (4 mg total) by mouth every 8 (eight) hours as needed for nausea or vomiting. 04/28/21   Aundra Dubin, PA-C  oxyCODONE (OXY IR/ROXICODONE) 5 MG immediate release tablet Take 1 tablet (5 mg total) by mouth every 6 (six) hours as needed for moderate pain or severe pain. 05/03/20   Shivaji, Melida Quitter, MD  Prenatal Vit-Fe Fumarate-FA (PRENATAL MULTIVITAMIN) TABS tablet Take 1 tablet by mouth daily at 12 noon.    [provider]    Allergies    Sulfa antibiotics  Review of Systems   Review of Systems  Constitutional:  Negative for fever.  HENT:  Positive for ear pain and sore throat.   Skin:  Negative for rash.   Physical Exam Updated Vital Signs BP (!) 134/108 (BP Location: Left  Arm)   Pulse 73   Temp 98.1 F (36.7 C) (Oral)   Resp 16   SpO2 100%   Physical Exam Vitals and nursing note reviewed.  Constitutional:      General: She is not in acute distress.    Appearance: She is well-developed.     Comments: Well-appearing talking in complete sentences with normal phonation.  HENT:     Head: Atraumatic.     Ears:     Comments: Left TM obscured by cerumen, right TM normal    Nose: No congestion or rhinorrhea.     Mouth/Throat:     Mouth: Mucous membranes are moist. No oral lesions.     Pharynx: Oropharynx is clear. Uvula midline. No oropharyngeal exudate, posterior oropharyngeal erythema or uvula swelling.  Eyes:     Conjunctiva/sclera: Conjunctivae normal.  Pulmonary:     Effort: Pulmonary effort is normal.  Musculoskeletal:     Cervical back: Neck supple.  Skin:    Findings: No rash.  Neurological:     Mental Status: She is alert.  Psychiatric:        Mood and Affect: Mood normal.    ED Results / Procedures / Treatments   Labs (all labs ordered are listed, but only abnormal results are displayed) Labs Reviewed  GROUP A STREP BY PCR    EKG None  Radiology No results found.  Procedures Procedures   Medications Ordered in ED Medications - No data to display  ED Course  I have reviewed the triage vital signs and the nursing notes.  Pertinent labs & imaging results that were available during my care of the patient were reviewed by me and considered in my medical decision making (see chart for details).    MDM Rules/Calculators/A&P                           BP (!) 134/108 (BP Location: Left Arm)   Pulse 73   Temp 98.1 F (36.7 C) (Oral)   Resp 16   SpO2 100%   Final Clinical Impression(s) / ED Diagnoses Final diagnoses:  Sore throat    Rx / DC Orders ED Discharge Orders          Ordered    lidocaine (XYLOCAINE) 2 % solution  As needed        06/12/21 0911    acetaminophen (  TYLENOL) 500 MG tablet  Every 6 hours PRN         06/12/21 0911           8:16 AM Patient here with sore throat and left ear pain.  Left TM is obscured by cerumen but no pain with ear manipulation.  Throat exam unremarkable uvula midline no tonsillar enlargement or exudates, normal phonation.  Neck exam unremarkable.  COVID test offered but patient declined, will obtain strep test.  I have low suspicion for deep tissue infection such as peritonsillar abscess abscess, epiglottitis, Lemierre's disease or retropharyngeal abscess.  9:10 AM Strep test negative.  Will provide sxs treatment.  Return precaution given.    Domenic Moras, PA-C 06/12/21 [id number]    Davonna Belling, MD 06/12/21 [phone]

## 2021-06-13 ENCOUNTER — Ambulatory Visit (INDEPENDENT_AMBULATORY_CARE_PROVIDER_SITE_OTHER): Payer: Medicaid Other | Admitting: Orthopaedic Surgery

## 2021-06-13 ENCOUNTER — Encounter: Payer: Self-pay | Admitting: Orthopaedic Surgery

## 2021-06-13 DIAGNOSIS — G5602 Carpal tunnel syndrome, left upper limb: Secondary | ICD-10-CM | POA: Insufficient documentation

## 2021-06-13 DIAGNOSIS — G5601 Carpal tunnel syndrome, right upper limb: Secondary | ICD-10-CM

## 2021-06-13 NOTE — Progress Notes (Signed)
   Post-Op Visit Note   Patient: Danielle Miller           Date of Birth: Apr 03, 1991           MRN: [id number] Visit Date: 06/13/2021 PCP: Patient, No Pcp Per (Inactive)   Assessment & Plan:  Chief Complaint:  Chief Complaint  Patient presents with   Right Wrist - Routine Post Op   Visit Diagnoses:  1. Left carpal tunnel syndrome   2. Right carpal tunnel syndrome     Plan: Danielle Miller is status post right carpal tunnel release on 05/02/2021.  Overall she is significantly better.  She is sleeping much better at night and she has had complete resolution of numbness except for the radial border of the ring finger.  She is overall very pleased.  She would like to have the left carpal tunnel surgery scheduled soon as possible.  The right hand surgical scars fully healed.  No signs of infection.  Full composite fist and thumb opposition to the fifth metacarpal head and  At this point we will go ahead and help her schedule for the left carpal tunnel syndrome.  Questions encouraged and answered.   Follow-Up Instructions: Return for Postop.   Orders:  No orders of the defined types were placed in this encounter.  No orders of the defined types were placed in this encounter.   Imaging: No results found.  PMFS History: Patient Active Problem List   Diagnosis Date Noted   Left carpal tunnel syndrome 06/13/2021   Right carpal tunnel syndrome 04/23/2021   Maternal care due to low transverse uterine scar from previous cesarean delivery 05/01/2020   S/P cesarean section 05/01/2020   Diet controlled gestational diabetes mellitus (GDM) in third trimester 03/02/2020   Past Medical History:  Diagnosis Date   Anemia    Anxiety    Asthma    Depression    doing ok   Fibroid    Gestational diabetes     Family History  Problem Relation Age of Onset   Diabetes Mother    Hypertension Mother    Healthy Father     Past Surgical History:  Procedure Laterality Date   CESAREAN SECTION      CESAREAN SECTION N/A 05/01/2020   Procedure: CESAREAN SECTION;  Surgeon: Cheri Fowler, MD;  Location: [facility];  Service: Obstetrics;  Laterality: N/A;  Tracey RNFA   Social History   Occupational History   Not on file  Tobacco Use   Smoking status: Former    Packs/day: 0.25    Types: Cigarettes   Smokeless tobacco: Never   Tobacco comments:    quit in first trimester  Vaping Use   Vaping Use: Never used  Substance and Sexual Activity   Alcohol use: Not Currently    Comment: socially    Drug use: Not Currently    Types: Marijuana    Comment: 1st trimester   Sexual activity: Yes    Birth control/protection: None

## 2021-06-18 ENCOUNTER — Other Ambulatory Visit: Payer: Self-pay | Admitting: Physician Assistant

## 2021-06-18 MED ORDER — ONDANSETRON HCL 4 MG PO TABS: 4.0000 mg | ORAL_TABLET | Freq: Three times a day (TID) | ORAL | 0 refills | Status: DC | PRN

## 2021-06-18 MED ORDER — HYDROCODONE-ACETAMINOPHEN 5-325 MG PO TABS: 1.0000 | ORAL_TABLET | Freq: Three times a day (TID) | ORAL | 0 refills | Status: AC | PRN

## 2021-06-20 ENCOUNTER — Other Ambulatory Visit: Payer: Self-pay | Admitting: Physician Assistant

## 2021-06-20 DIAGNOSIS — G5602 Carpal tunnel syndrome, left upper limb: Secondary | ICD-10-CM | POA: Diagnosis not present

## 2021-06-27 ENCOUNTER — Other Ambulatory Visit: Payer: Self-pay

## 2021-06-27 ENCOUNTER — Ambulatory Visit (INDEPENDENT_AMBULATORY_CARE_PROVIDER_SITE_OTHER): Payer: Medicaid Other | Admitting: Orthopaedic Surgery

## 2021-06-27 ENCOUNTER — Encounter: Payer: Self-pay | Admitting: Orthopaedic Surgery

## 2021-06-27 DIAGNOSIS — G5602 Carpal tunnel syndrome, left upper limb: Secondary | ICD-10-CM

## 2021-06-27 NOTE — Progress Notes (Signed)
   Post-Op Visit Note   Patient: Danielle Miller           Date of Birth: November 06, 1991           MRN: [id number] Visit Date: 06/27/2021 PCP: Patient, No Pcp Per (Inactive)   Assessment & Plan:  Chief Complaint:  Chief Complaint  Patient presents with   Left Hand - Pain   Visit Diagnoses:  1. Left carpal tunnel syndrome     Plan: Danielle Miller is 1 week status post left carpal tunnel release.  Doing well overall.  Has a little bit of residual numbness in her fingertips but this is significantly improved since surgery.  Surgical incision is intact.  This was covered with a Band-Aid and she was placed in a carpal tunnel brace today.  Follow-up next week for suture removal.  Continue weight lifting restrictions to less than 10 pounds for a total of 4 weeks.  Follow-Up Instructions: Return in about 1 week (around 07/04/2021).   Orders:  No orders of the defined types were placed in this encounter.  No orders of the defined types were placed in this encounter.   Imaging: No results found.  PMFS History: Patient Active Problem List   Diagnosis Date Noted   Left carpal tunnel syndrome 06/13/2021   Right carpal tunnel syndrome 04/23/2021   Maternal care due to low transverse uterine scar from previous cesarean delivery 05/01/2020   S/P cesarean section 05/01/2020   Diet controlled gestational diabetes mellitus (GDM) in third trimester 03/02/2020   Past Medical History:  Diagnosis Date   Anemia    Anxiety    Asthma    Depression    doing ok   Fibroid    Gestational diabetes     Family History  Problem Relation Age of Onset   Diabetes Mother    Hypertension Mother    Healthy Father     Past Surgical History:  Procedure Laterality Date   CESAREAN SECTION     CESAREAN SECTION N/A 05/01/2020   Procedure: CESAREAN SECTION;  Surgeon: Cheri Fowler, MD;  Location: [facility];  Service: Obstetrics;  Laterality: N/A;  Tracey RNFA   Social History   Occupational History    Not on file  Tobacco Use   Smoking status: Former    Packs/day: 0.25    Types: Cigarettes   Smokeless tobacco: Never   Tobacco comments:    quit in first trimester  Vaping Use   Vaping Use: Never used  Substance and Sexual Activity   Alcohol use: Not Currently    Comment: socially    Drug use: Not Currently    Types: Marijuana    Comment: 1st trimester   Sexual activity: Yes    Birth control/protection: None

## 2021-07-04 ENCOUNTER — Other Ambulatory Visit: Payer: Self-pay

## 2021-07-04 ENCOUNTER — Ambulatory Visit: Payer: Medicaid Other

## 2021-07-23 ENCOUNTER — Telehealth: Payer: Self-pay | Admitting: Orthopaedic Surgery

## 2021-07-23 NOTE — Telephone Encounter (Signed)
[place] [employer] forms received. To [employer]. °

## 2021-08-01 ENCOUNTER — Encounter: Payer: Self-pay | Admitting: Orthopaedic Surgery

## 2021-08-01 ENCOUNTER — Other Ambulatory Visit: Payer: Self-pay

## 2021-08-01 ENCOUNTER — Ambulatory Visit (INDEPENDENT_AMBULATORY_CARE_PROVIDER_SITE_OTHER): Payer: Medicaid Other | Admitting: Physician Assistant

## 2021-08-01 DIAGNOSIS — G5602 Carpal tunnel syndrome, left upper limb: Secondary | ICD-10-CM

## 2021-08-01 DIAGNOSIS — Z9889 Other specified postprocedural states: Secondary | ICD-10-CM

## 2021-08-01 NOTE — Progress Notes (Signed)
   Post-Op Visit Note   Patient: Danielle Miller           Date of Birth: Dec 14, 1990           MRN: [id number] Visit Date: 08/01/2021 PCP: Patient, No Pcp Per (Inactive)   Assessment & Plan:  Chief Complaint:  Chief Complaint  Patient presents with   Left Hand - Pain   Visit Diagnoses:  1. Left carpal tunnel syndrome   2. S/P carpal tunnel release     Plan: Patient is a pleasant 30 year old female who comes in today 6 weeks status post left carpal tunnel release.  She denies any pain or paresthesias but does complain of weakness to the left hand.  She continues to wear her removable wrist splint.  Examination of the left hand reveals a fully healed surgical scar without complication.  Fingers are warm well perfused.  She has slight decreased grip strength.  She is neurovascular intact distally.  At this point, she may increase activity as tolerated.  I would like to start her in some hand therapy and have also recommended a stress ball or putty to help with strengthening of the left hand.  She is agreeable to this.  Strengthening of the left hand.  She is agreeable to this.  We will allow her to return to work at [employer] next Monday without lifting greater than 10 pounds to the left hand for 4 weeks.  Follow-up with [place] as needed.  Follow-Up Instructions: Return if symptoms worsen or fail to improve.   Orders:  No orders of the defined types were placed in this encounter.  No orders of the defined types were placed in this encounter.   Imaging: No new imaging  PMFS History: Patient Active Problem List   Diagnosis Date Noted   Left carpal tunnel syndrome 06/13/2021   Right carpal tunnel syndrome 04/23/2021   Maternal care due to low transverse uterine scar from previous cesarean delivery 05/01/2020   S/P cesarean section 05/01/2020   Diet controlled gestational diabetes mellitus (GDM) in third trimester 03/02/2020   Past Medical History:  Diagnosis Date   Anemia    Anxiety     Asthma    Depression    doing ok   Fibroid    Gestational diabetes     Family History  Problem Relation Age of Onset   Diabetes Mother    Hypertension Mother    Healthy Father     Past Surgical History:  Procedure Laterality Date   CESAREAN SECTION     CESAREAN SECTION N/A 05/01/2020   Procedure: CESAREAN SECTION;  Surgeon: Cheri Fowler, MD;  Location: [facility];  Service: Obstetrics;  Laterality: N/A;  Tracey RNFA   Social History   Occupational History   Not on file  Tobacco Use   Smoking status: Former    Packs/day: 0.25    Types: Cigarettes   Smokeless tobacco: Never   Tobacco comments:    quit in first trimester  Vaping Use   Vaping Use: Never used  Substance and Sexual Activity   Alcohol use: Not Currently    Comment: socially    Drug use: Not Currently    Types: Marijuana    Comment: 1st trimester   Sexual activity: Yes    Birth control/protection: None

## 2022-03-06 ENCOUNTER — Other Ambulatory Visit: Payer: Self-pay

## 2022-03-06 ENCOUNTER — Emergency Department (HOSPITAL_COMMUNITY)
Admission: EM | Admit: 2022-03-06 | Discharge: 2022-03-06 | Disposition: A | Discharge status: Home / Self Care | Payer: Medicaid Other | Attending: Emergency Medicine | Admitting: Emergency Medicine

## 2022-03-06 ENCOUNTER — Encounter (HOSPITAL_COMMUNITY): Payer: Self-pay

## 2022-03-06 ENCOUNTER — Emergency Department (HOSPITAL_COMMUNITY): Payer: Medicaid Other

## 2022-03-06 DIAGNOSIS — R0789 Other chest pain: Secondary | ICD-10-CM | POA: Diagnosis not present

## 2022-03-06 DIAGNOSIS — J029 Acute pharyngitis, unspecified: Secondary | ICD-10-CM | POA: Insufficient documentation

## 2022-03-06 DIAGNOSIS — R059 Cough, unspecified: Secondary | ICD-10-CM | POA: Diagnosis present

## 2022-03-06 DIAGNOSIS — M549 Dorsalgia, unspecified: Secondary | ICD-10-CM | POA: Diagnosis not present

## 2022-03-06 DIAGNOSIS — J069 Acute upper respiratory infection, unspecified: Secondary | ICD-10-CM

## 2022-03-06 NOTE — ED Triage Notes (Signed)
Patient c/o a productive cough with light green sputum, chest pain, and back pain x 2 days. ?

## 2022-03-06 NOTE — ED Notes (Signed)
I provided reinforced discharge education based off of discharge instructions. Pt acknowledged and understood my education. Pt had no further questions/concerns for provider/myself.  °

## 2022-03-06 NOTE — ED Provider Notes (Signed)
?[facility] ?Provider Note ? ? ?CSN: [id number] ?Arrival date & time: 03/06/22  5284 ? ?  ? ?History ? ?Chief Complaint  ?Patient presents with  ? Chest Pain  ? Back Pain  ? Cough  ? ? ?Danielle Miller is a 31 y.o. female. ? ?31 year old female presents with 24 hours of URI symptoms.  No vomiting or diarrhea.  Mild sore throat.  Slight cough.  Has had some slight chest tightness associated with the cough.  No cardiac complaints.  Has been using over-the-counter medications without relief ? ? ?  ? ?Home Medications ?Prior to Admission medications   ?Medication Sig Start Date End Date Taking? Authorizing Provider  ?acetaminophen (TYLENOL) 500 MG tablet Take 2 tablets (1,000 mg total) by mouth every 6 (six) hours as needed for moderate pain, fever, headache or mild pain. 06/12/21   Domenic Moras, PA-C  ?docusate sodium (COLACE) 100 MG capsule Take 1 capsule (100 mg total) by mouth 2 (two) times daily as needed for mild constipation. 05/03/20   Shivaji, Melida Quitter, MD  ?ferrous sulfate 325 (65 FE) MG tablet Take 325 mg by mouth daily with breakfast.    [provider]  ?HYDROcodone-acetaminophen (NORCO) 5-325 MG tablet Take 1 tablet by mouth 3 (three) times daily as needed. 06/18/21   Aundra Dubin, PA-C  ?ibuprofen (ADVIL) 800 MG tablet Take 1 tablet (800 mg total) by mouth every 8 (eight) hours as needed. 05/03/20   Shivaji, Melida Quitter, MD  ?lidocaine (XYLOCAINE) 2 % solution Use as directed 15 mLs in the mouth or throat as needed for mouth pain. 06/12/21   Domenic Moras, PA-C  ?ondansetron (ZOFRAN) 4 MG tablet Take 1 tablet (4 mg total) by mouth every 8 (eight) hours as needed for nausea or vomiting. 06/18/21   Aundra Dubin, PA-C  ?oxyCODONE (OXY IR/ROXICODONE) 5 MG immediate release tablet Take 1 tablet (5 mg total) by mouth every 6 (six) hours as needed for moderate pain or severe pain. 05/03/20   Shivaji, Melida Quitter, MD  ?Prenatal Vit-Fe Fumarate-FA (PRENATAL MULTIVITAMIN) TABS  tablet Take 1 tablet by mouth daily at 12 noon.    [provider]  ?   ? ?Allergies    ?Ibuprofen and Sulfa antibiotics   ? ?Review of Systems   ?Review of Systems  ?All other systems reviewed and are negative. ? ?Physical Exam ?Updated Vital Signs ?BP 130/89   Pulse 83   Temp 98.5 ?F (36.9 ?C) (Oral)   Resp 16   Ht 1.727 m ('5\' 8"'$ )   Wt 81.6 kg   LMP 02/24/2022   SpO2 98%   BMI 27.37 kg/m?  ?Physical Exam ?Vitals and nursing note reviewed.  ?Constitutional:   ?   General: She is not in acute distress. ?   Appearance: Normal appearance. She is well-developed. She is not toxic-appearing.  ?HENT:  ?   Head: Normocephalic and atraumatic.  ?Eyes:  ?   General: Lids are normal.  ?   Conjunctiva/sclera: Conjunctivae normal.  ?   Pupils: Pupils are equal, round, and reactive to light.  ?Neck:  ?   Thyroid: No thyroid mass.  ?   Trachea: No tracheal deviation.  ?Cardiovascular:  ?   Rate and Rhythm: Normal rate and regular rhythm.  ?   Heart sounds: Normal heart sounds. No murmur heard. ?  No gallop.  ?Pulmonary:  ?   Effort: Pulmonary effort is normal. No respiratory distress.  ?   Breath sounds: Normal breath  sounds. No stridor. No decreased breath sounds, wheezing, rhonchi or rales.  ?Abdominal:  ?   General: There is no distension.  ?   Palpations: Abdomen is soft.  ?   Tenderness: There is no abdominal tenderness. There is no rebound.  ?Musculoskeletal:     ?   General: No tenderness. Normal range of motion.  ?   Cervical back: Normal range of motion and neck supple.  ?Skin: ?   General: Skin is warm and dry.  ?   Findings: No abrasion or rash.  ?Neurological:  ?   Mental Status: She is alert and oriented to person, place, and time. Mental status is at baseline.  ?   GCS: GCS eye subscore is 4. GCS verbal subscore is 5. GCS motor subscore is 6.  ?   Cranial Nerves: No cranial nerve deficit.  ?   Sensory: No sensory deficit.  ?   Motor: Motor function is intact.  ?Psychiatric:     ?   Attention and  Perception: Attention normal.     ?   Speech: Speech normal.     ?   Behavior: Behavior normal.  ? ? ?ED Results / Procedures / Treatments   ?Labs ?(all labs ordered are listed, but only abnormal results are displayed) ?Labs Reviewed - No data to display ? ?EKG ?None ? ?Radiology ?DG Chest 2 View ? ?Result Date: 03/06/2022 ?CLINICAL DATA:  31 year old female with history of productive cough. Light green sputum. Chest pain and back pain. EXAM: CHEST - 2 VIEW COMPARISON:  Chest x-ray 01/14/2018. FINDINGS: Lung volumes are normal. No consolidative airspace disease. No pleural effusions. No pneumothorax. No pulmonary nodule or mass noted. Pulmonary vasculature and the cardiomediastinal silhouette are within normal limits. IMPRESSION: No radiographic evidence of acute cardiopulmonary disease. Electronically Signed   By: Vinnie Langton M.D.   On: 03/06/2022 09:44   ? ?Procedures ?Procedures  ? ? ?Medications Ordered in ED ?Medications - No data to display ? ?ED Course/ Medical Decision Making/ A&P ?  ?                        ?Medical Decision Making ?Amount and/or Complexity of Data Reviewed ?Radiology: ordered. ? ? ?Patient's chest x-ray is negative here per my review interpretation.  Patient with likely viral illness.  Will discharge ? ? ? ? ? ? ? ?Final Clinical Impression(s) / ED Diagnoses ?Final diagnoses:  ?None  ? ? ?Rx / DC Orders ?ED Discharge Orders   ? ? None  ? ?  ? ? ?  ?Lacretia Leigh, MD ?03/06/22 1014 ? ?

## 2022-08-31 ENCOUNTER — Other Ambulatory Visit: Payer: Self-pay

## 2022-08-31 ENCOUNTER — Emergency Department (HOSPITAL_COMMUNITY)
Admission: EM | Admit: 2022-08-31 | Discharge: 2022-08-31 | Discharge status: Against Medical Advice | Payer: Medicaid Other | Attending: Emergency Medicine | Admitting: Emergency Medicine

## 2022-08-31 ENCOUNTER — Encounter (HOSPITAL_COMMUNITY): Payer: Self-pay | Admitting: Emergency Medicine

## 2022-08-31 ENCOUNTER — Emergency Department (HOSPITAL_COMMUNITY): Payer: Medicaid Other

## 2022-08-31 DIAGNOSIS — R059 Cough, unspecified: Secondary | ICD-10-CM | POA: Diagnosis not present

## 2022-08-31 DIAGNOSIS — R0981 Nasal congestion: Secondary | ICD-10-CM | POA: Diagnosis not present

## 2022-08-31 DIAGNOSIS — R0789 Other chest pain: Secondary | ICD-10-CM | POA: Diagnosis not present

## 2022-08-31 DIAGNOSIS — Z5321 Procedure and treatment not carried out due to patient leaving prior to being seen by health care provider: Secondary | ICD-10-CM | POA: Diagnosis not present

## 2022-08-31 DIAGNOSIS — J45909 Unspecified asthma, uncomplicated: Secondary | ICD-10-CM | POA: Diagnosis not present

## 2022-08-31 LAB — BASIC METABOLIC PANEL
Anion gap: 6 (ref 5–15)
BUN: 7 mg/dL (ref 6–20)
CO2: 24 mmol/L (ref 22–32)
Calcium: 8.6 mg/dL — ABNORMAL LOW (ref 8.9–10.3)
Chloride: 109 mmol/L (ref 98–111)
Creatinine, Ser: 0.8 mg/dL (ref 0.44–1.00)
GFR, Estimated: >60 mL/min (ref >60)
Glucose, Bld: 95 mg/dL (ref 70–99)
Potassium: 3.6 mmol/L (ref 3.5–5.1)
Sodium: 139 mmol/L (ref 135–145)

## 2022-08-31 LAB — CBC
HCT: 37.1 % (ref 36.0–46.0)
Hemoglobin: 11.6 g/dL — ABNORMAL LOW (ref 12.0–15.0)
MCH: 26.4 pg (ref 26.0–34.0)
MCHC: 31.3 g/dL (ref 30.0–36.0)
MCV: 84.5 fL (ref 80.0–100.0)
Platelets: 178 10*3/uL (ref 150–400)
RBC: 4.39 MIL/uL (ref 3.87–5.11)
RDW: 13.4 % (ref 11.5–15.5)
WBC: 5.3 10*3/uL (ref 4.0–10.5)
nRBC: 0 % (ref 0.0–0.2)

## 2022-08-31 LAB — I-STAT BETA HCG BLOOD, ED (MC, WL, AP ONLY): I-stat hCG, quantitative: <5 m[IU]/mL (ref <5)

## 2022-08-31 LAB — TROPONIN I (HIGH SENSITIVITY): Troponin I (High Sensitivity): <2 ng/L (ref <18)

## 2022-08-31 NOTE — ED Notes (Signed)
Called for pt x3. Pt removed from floor.

## 2022-08-31 NOTE — ED Triage Notes (Signed)
Patient with history of asthma here with complaint of nasal congestion, productive cough that started Friday followed by and chest pressure that started yesterday. Patient is alert, oriented, afebrile in triage and is in no apparent distress at this time.

## 2022-08-31 NOTE — ED Notes (Signed)
Pt called multiple times for a room, no answer

## 2022-09-01 ENCOUNTER — Encounter (HOSPITAL_COMMUNITY): Payer: Self-pay

## 2022-09-01 ENCOUNTER — Ambulatory Visit (HOSPITAL_COMMUNITY)
Admission: RE | Admit: 2022-09-01 | Discharge: 2022-09-01 | Disposition: A | Discharge status: Home / Self Care | Payer: Medicaid Other | Source: Ambulatory Visit | Attending: Family Medicine | Admitting: Family Medicine

## 2022-09-01 VITALS — BP 151/104 | HR 77 | Temp 98.6°F | Resp 18

## 2022-09-01 DIAGNOSIS — Z1152 Encounter for screening for COVID-19: Secondary | ICD-10-CM | POA: Diagnosis not present

## 2022-09-01 DIAGNOSIS — J069 Acute upper respiratory infection, unspecified: Secondary | ICD-10-CM | POA: Diagnosis present

## 2022-09-01 DIAGNOSIS — J45909 Unspecified asthma, uncomplicated: Secondary | ICD-10-CM | POA: Insufficient documentation

## 2022-09-01 DIAGNOSIS — J4521 Mild intermittent asthma with (acute) exacerbation: Secondary | ICD-10-CM | POA: Diagnosis present

## 2022-09-01 MED ORDER — PREDNISONE 20 MG PO TABS: 40.0000 mg | ORAL_TABLET | Freq: Every day | ORAL | 0 refills | Status: AC

## 2022-09-01 NOTE — ED Provider Notes (Addendum)
[facility]    CSN: [id number] Arrival date & time: 09/01/22  1528      History   Chief Complaint Chief Complaint  Patient presents with   Cough    Entered by patient    HPI Danielle Miller is a 31 y.o. female.    Cough  Here for malaise and chest heaviness that began on October 13.  The next day she found her voice raspy, and she had some nasal drainage and nasal congestion.  No fever and no chills.  No nausea or vomiting or diarrhea.  She did have inhaler to use and it did help her symptoms a little bit.  She does have a history of asthma  She works at an [employer].  Last menstrual cycle is now  Past Medical History:  Diagnosis Date   Anemia    Anxiety    Asthma    Depression    doing ok   Fibroid    Gestational diabetes     Patient Active Problem List   Diagnosis Date Noted   Left carpal tunnel syndrome 06/13/2021   Right carpal tunnel syndrome 04/23/2021   Maternal care due to low transverse uterine scar from previous cesarean delivery 05/01/2020   S/P cesarean section 05/01/2020   Diet controlled gestational diabetes mellitus (GDM) in third trimester 03/02/2020    Past Surgical History:  Procedure Laterality Date   CARPAL TUNNEL RELEASE Bilateral    CESAREAN SECTION     CESAREAN SECTION N/A 05/01/2020   Procedure: CESAREAN SECTION;  Surgeon: Cheri Fowler, MD;  Location: [facility];  Service: Obstetrics;  Laterality: N/A;  Tracey RNFA    OB History     Gravida  2   Para  2   Term  2   Preterm      AB      Living  2      SAB      IAB      Ectopic      Multiple  0   Live Births  2        Obstetric Comments  Failed to dilate, 9#15oz ([place])          Home Medications    Prior to Admission medications   Medication Sig Start Date End Date Taking? Authorizing Provider  predniSONE (DELTASONE) 20 MG tablet Take 2 tablets (40 mg total) by mouth daily with breakfast for 5 days. 09/01/22 09/06/22 Yes  Hisham Provence, Gwenlyn Perking, MD  acetaminophen (TYLENOL) 500 MG tablet Take 2 tablets (1,000 mg total) by mouth every 6 (six) hours as needed for moderate pain, fever, headache or mild pain. 06/12/21   Domenic Moras, PA-C  docusate sodium (COLACE) 100 MG capsule Take 1 capsule (100 mg total) by mouth 2 (two) times daily as needed for mild constipation. 05/03/20   Shivaji, Melida Quitter, MD  ferrous sulfate 325 (65 FE) MG tablet Take 325 mg by mouth daily with breakfast.    [provider]  HYDROcodone-acetaminophen (NORCO) 5-325 MG tablet Take 1 tablet by mouth 3 (three) times daily as needed. 06/18/21   Aundra Dubin, PA-C  ibuprofen (ADVIL) 800 MG tablet Take 1 tablet (800 mg total) by mouth every 8 (eight) hours as needed. 05/03/20   Shivaji, Melida Quitter, MD  lidocaine (XYLOCAINE) 2 % solution Use as directed 15 mLs in the mouth or throat as needed for mouth pain. 06/12/21   Domenic Moras, PA-C  Prenatal Vit-Fe Fumarate-FA (PRENATAL MULTIVITAMIN) TABS tablet  Take 1 tablet by mouth daily at 12 noon.    [provider]    Family History Family History  Problem Relation Age of Onset   Diabetes Mother    Hypertension Mother    Healthy Father     Social History Social History   Tobacco Use   Smoking status: Former    Packs/day: 0.25    Types: Cigarettes   Smokeless tobacco: Never   Tobacco comments:    quit in first trimester  Vaping Use   Vaping Use: Never used  Substance Use Topics   Alcohol use: Not Currently    Comment: socially    Drug use: Not Currently    Types: Marijuana    Comment: 1st trimester     Allergies   Ibuprofen and Sulfa antibiotics   Review of Systems Review of Systems  Respiratory:  Positive for cough.      Physical Exam Triage Vital Signs ED Triage Vitals [09/01/22 1600]  Enc Vitals Group     BP (!) 151/104     Pulse Rate 77     Resp 18     Temp 98.6 F (37 C)     Temp Source Oral     SpO2 98 %     Weight      Height      Head Circumference       Peak Flow      Pain Score      Pain Loc      Pain Edu?      Excl. in [place]?    No data found.  Updated Vital Signs BP (!) 151/104 (BP Location: Left Arm)   Pulse 77   Temp 98.6 F (37 C) (Oral)   Resp 18   LMP 08/31/2022   SpO2 98%   Visual Acuity Right Eye Distance:   Left Eye Distance:   Bilateral Distance:    Right Eye Near:   Left Eye Near:    Bilateral Near:     Physical Exam Vitals reviewed.  Constitutional:      General: She is not in acute distress.    Appearance: She is not toxic-appearing.  HENT:     Right Ear: Tympanic membrane and ear canal normal.     Left Ear: Tympanic membrane and ear canal normal.     Nose: Nose normal.     Mouth/Throat:     Mouth: Mucous membranes are moist.     Pharynx: No oropharyngeal exudate or posterior oropharyngeal erythema.  Eyes:     Extraocular Movements: Extraocular movements intact.     Conjunctiva/sclera: Conjunctivae normal.     Pupils: Pupils are equal, round, and reactive to light.  Cardiovascular:     Rate and Rhythm: Normal rate and regular rhythm.     Heart sounds: No murmur heard. Pulmonary:     Effort: Pulmonary effort is normal. No respiratory distress.     Breath sounds: No stridor. No wheezing, rhonchi or rales.  Musculoskeletal:     Cervical back: Neck supple.  Lymphadenopathy:     Cervical: No cervical adenopathy.  Skin:    Capillary Refill: Capillary refill takes less than 2 seconds.     Coloration: Skin is not jaundiced or pale.  Neurological:     General: No focal deficit present.     Mental Status: She is alert and oriented to person, place, and time.  Psychiatric:        Behavior: Behavior normal.  UC Treatments / Results  Labs (all labs ordered are listed, but only abnormal results are displayed) Labs Reviewed  SARS CORONAVIRUS 2 (TAT 6-24 HRS)    EKG   Radiology DG Chest 2 View  Result Date: 08/31/2022 CLINICAL DATA:  Chest pain EXAM: CHEST - 2 VIEW COMPARISON:   03/06/2022 FINDINGS: The heart size and mediastinal contours are within normal limits. Both lungs are clear. The visualized skeletal structures are unremarkable. IMPRESSION: No active cardiopulmonary disease. Electronically Signed   By: Elmer Picker M.D.   On: 08/31/2022 11:35    Procedures Procedures (including critical care time)  Medications Ordered in UC Medications - No data to display  Initial Impression / Assessment and Plan / UC Course  I have reviewed the triage vital signs and the nursing notes.  Pertinent labs & imaging results that were available during my care of the patient were reviewed by me and considered in my medical decision making (see chart for details).        On review of the results from her x-rays and lab in the ER, her chest x-ray was negative.  We will swab for COVID and if she is positive she should have a prescription for Paxlovid.  Her EGFR in the ER was over 60  I am also going to go ahead and treat for asthma exacerbation with some prednisone Final Clinical Impressions(s) / UC Diagnoses   Final diagnoses:  Viral URI with cough  Mild intermittent asthma with exacerbation     Discharge Instructions      In the emergency room, your chest x-ray was clear and did not show any pneumonia  Lab work was all essentially normal  Take prednisone 20 mg--2 daily for 5 days; this is for inflammation in your lungs  Continue using your inhaler as needed     ED Prescriptions     Medication Sig Dispense Auth. Provider   predniSONE (DELTASONE) 20 MG tablet Take 2 tablets (40 mg total) by mouth daily with breakfast for 5 days. 10 tablet Windy Carina Gwenlyn Perking, MD      PDMP not reviewed this encounter.   Barrett Henle, MD 09/01/22 1629    Barrett Henle, MD 09/01/22 [id number]

## 2022-09-01 NOTE — Discharge Instructions (Addendum)
In the emergency room, your chest x-ray was clear and did not show any pneumonia  Lab work was all essentially normal  Take prednisone 20 mg--2 daily for 5 days; this is for inflammation in your lungs  Continue using your inhaler as needed

## 2022-09-01 NOTE — ED Triage Notes (Signed)
Pt presents with cough and congestion x 3-4 days.  Pt reports feeling fatigue with body aches.  Pt is not currently taking any medication.

## 2022-09-02 LAB — SARS CORONAVIRUS 2 (TAT 6-24 HRS): SARS Coronavirus 2: NEGATIVE

## 2022-10-30 ENCOUNTER — Ambulatory Visit (INDEPENDENT_AMBULATORY_CARE_PROVIDER_SITE_OTHER): Payer: Medicaid Other | Admitting: Orthopaedic Surgery

## 2022-10-30 DIAGNOSIS — M79641 Pain in right hand: Secondary | ICD-10-CM | POA: Diagnosis not present

## 2022-10-30 MED ORDER — PREDNISONE 10 MG (21) PO TBPK: ORAL_TABLET | ORAL | 3 refills | Status: AC

## 2022-10-30 MED ORDER — DICLOFENAC SODIUM 75 MG PO TBEC: 75.0000 mg | DELAYED_RELEASE_TABLET | Freq: Two times a day (BID) | ORAL | 2 refills | Status: AC

## 2022-10-30 NOTE — Progress Notes (Signed)
Office Visit Note   Patient: Danielle Miller           Date of Birth: [birth year]           MRN: [id number] Visit Date: 10/30/2022              Requested by: No referring provider defined for this encounter. PCP: Patient, No Pcp Per   Assessment & Plan: Visit Diagnoses:  1. Pain in right hand     Plan: Impression is acute right hand pain and numbness.  She is about a year status post right carpal tunnel release and she has been doing well from this.  Unclear etiology perhaps mild Decore veins and median neuritis.  Recommend immobilization with thumb spica brace.  I would like to put her on a week of prednisone and a week of diclofenac.  She will follow-up if her symptoms do not improve in a few weeks.  Follow-Up Instructions: No follow-ups on file.   Orders:  No orders of the defined types were placed in this encounter.  Meds ordered this encounter  Medications   predniSONE (STERAPRED UNI-PAK 21 TAB) 10 MG (21) TBPK tablet    Sig: Take as directed    Dispense:  21 tablet    Refill:  3   diclofenac (VOLTAREN) 75 MG EC tablet    Sig: Take 1 tablet (75 mg total) by mouth 2 (two) times daily.    Dispense:  30 tablet    Refill:  2      Procedures: No procedures performed   Clinical Data: No additional findings.   Subjective: Chief Complaint  Patient presents with   Right Hand - Pain    HPI Danielle Miller comes in today for evaluation of acute onset of right hand pain and swelling.  Denies any changes in activity or injuries.  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  Does not do any significant repetitive activities.  Woke up this morning and felt numbness and tingling and the radial 3 digits.  Fingertips feel tender.  She feels generalized swelling.  Review of Systems  Constitutional: Negative.   HENT: Negative.    Eyes: Negative.   Respiratory: Negative.    Cardiovascular: Negative.   Endocrine: Negative.   Musculoskeletal: Negative.   Neurological: Negative.    Hematological: Negative.   Psychiatric/Behavioral: Negative.    All other systems reviewed and are negative.    Objective: Vital Signs: There were no vitals taken for this visit.  Physical Exam Vitals and nursing note reviewed.  Constitutional:      Appearance: She is well-developed.  HENT:     Head: Atraumatic.     Nose: Nose normal.  Eyes:     Extraocular Movements: Extraocular movements intact.  Cardiovascular:     Pulses: Normal pulses.  Pulmonary:     Effort: Pulmonary effort is normal.  Abdominal:     Palpations: Abdomen is soft.  Musculoskeletal:     Cervical back: Neck supple.  Skin:    General: Skin is warm.     Capillary Refill: Capillary refill takes less than 2 seconds.  Neurological:     Mental Status: She is alert. Mental status is at baseline.  Psychiatric:        Behavior: Behavior normal.        Thought Content: Thought content normal.        Judgment: Judgment normal.     Ortho Exam Examined patient of the right hand shows fully healed carpal  tunnel surgery scar.  Negative carpal tunnel compressive signs.  Slight tenderness to the first dorsal compartment.  Mildly positive Finkelstein's.  I do not see any significant swelling.  Skin temperature normal.  Strong pulses.  Normal sensation. Specialty Comments:  No specialty comments available.  Imaging: No results found.   PMFS History: Patient Active Problem List   Diagnosis Date Noted   Left carpal tunnel syndrome 06/13/2021   Right carpal tunnel syndrome 04/23/2021   Maternal care due to low transverse uterine scar from previous cesarean delivery 05/01/2020   S/P cesarean section 05/01/2020   Diet controlled gestational diabetes mellitus (GDM) in third trimester 03/02/2020   Past Medical History:  Diagnosis Date   Anemia    Anxiety    Asthma    Depression    doing ok   Fibroid    Gestational diabetes     Family History  Problem Relation Age of Onset   Diabetes Mother     Hypertension Mother    Healthy Father     Past Surgical History:  Procedure Laterality Date   CARPAL TUNNEL RELEASE Bilateral    CESAREAN SECTION     CESAREAN SECTION N/A 05/01/2020   Procedure: CESAREAN SECTION;  Surgeon: Cheri Fowler, MD;  Location: [facility];  Service: Obstetrics;  Laterality: N/A;  Tracey RNFA   Social History   Occupational History   Not on file  Tobacco Use   Smoking status: Former    Packs/day: 0.25    Types: Cigarettes   Smokeless tobacco: Never   Tobacco comments:    quit in first trimester  Vaping Use   Vaping Use: Never used  Substance and Sexual Activity   Alcohol use: Not Currently    Comment: socially    Drug use: Not Currently    Types: Marijuana    Comment: 1st trimester   Sexual activity: Yes    Birth control/protection: None

## 2024-01-28 ENCOUNTER — Emergency Department (HOSPITAL_COMMUNITY)
Admission: EM | Admit: 2024-01-28 | Discharge: 2024-01-28 | Disposition: A | Discharge status: Home / Self Care | Attending: Student | Admitting: Student

## 2024-01-28 ENCOUNTER — Other Ambulatory Visit: Payer: Self-pay

## 2024-01-28 ENCOUNTER — Emergency Department (HOSPITAL_COMMUNITY)

## 2024-01-28 ENCOUNTER — Encounter (HOSPITAL_COMMUNITY): Payer: Self-pay

## 2024-01-28 DIAGNOSIS — R079 Chest pain, unspecified: Secondary | ICD-10-CM | POA: Diagnosis present

## 2024-01-28 DIAGNOSIS — J069 Acute upper respiratory infection, unspecified: Secondary | ICD-10-CM | POA: Insufficient documentation

## 2024-01-28 DIAGNOSIS — R0789 Other chest pain: Secondary | ICD-10-CM

## 2024-01-28 DIAGNOSIS — J45909 Unspecified asthma, uncomplicated: Secondary | ICD-10-CM | POA: Insufficient documentation

## 2024-01-28 LAB — CBC
HCT: 39.3 % (ref 36.0–46.0)
Hemoglobin: 12 g/dL (ref 12.0–15.0)
MCH: 25.2 pg — ABNORMAL LOW (ref 26.0–34.0)
MCHC: 30.5 g/dL (ref 30.0–36.0)
MCV: 82.4 fL (ref 80.0–100.0)
Platelets: 204 10*3/uL (ref 150–400)
RBC: 4.77 MIL/uL (ref 3.87–5.11)
RDW: 13.8 % (ref 11.5–15.5)
WBC: 5.7 10*3/uL (ref 4.0–10.5)
nRBC: 0 % (ref 0.0–0.2)

## 2024-01-28 LAB — HCG, SERUM, QUALITATIVE: Preg, Serum: NEGATIVE

## 2024-01-28 LAB — RESP PANEL BY RT-PCR (RSV, FLU A&B, COVID)  RVPGX2
Influenza A by PCR: NEGATIVE
Influenza B by PCR: NEGATIVE
Resp Syncytial Virus by PCR: NEGATIVE
SARS Coronavirus 2 by RT PCR: NEGATIVE

## 2024-01-28 LAB — TROPONIN I (HIGH SENSITIVITY): Troponin I (High Sensitivity): <2 ng/L (ref <18)

## 2024-01-28 LAB — BASIC METABOLIC PANEL
Anion gap: 8 (ref 5–15)
BUN: 9 mg/dL (ref 6–20)
CO2: 22 mmol/L (ref 22–32)
Calcium: 9 mg/dL (ref 8.9–10.3)
Chloride: 106 mmol/L (ref 98–111)
Creatinine, Ser: 0.93 mg/dL (ref 0.44–1.00)
GFR, Estimated: >60 mL/min (ref >60)
Glucose, Bld: 124 mg/dL — ABNORMAL HIGH (ref 70–99)
Potassium: 3.8 mmol/L (ref 3.5–5.1)
Sodium: 136 mmol/L (ref 135–145)

## 2024-01-28 MED ORDER — KETOROLAC TROMETHAMINE 15 MG/ML IJ SOLN
15.0000 mg | Freq: Once | INTRAMUSCULAR | Status: AC
  Administered 2024-01-28: 15 mg via INTRAVENOUS
  Filled 2024-01-28: qty 1

## 2024-01-28 MED ORDER — DIPHENHYDRAMINE HCL 50 MG/ML IJ SOLN
25.0000 mg | Freq: Once | INTRAMUSCULAR | Status: AC
  Administered 2024-01-28: 25 mg via INTRAVENOUS
  Filled 2024-01-28: qty 1

## 2024-01-28 NOTE — ED Provider Notes (Signed)
 [address] EMERGENCY DEPARTMENT AT [facility] Provider Note   CSN: [id number] Arrival date & time: 01/28/24  0102     History  Chief Complaint  Patient presents with   Chest Pain   Back Pain    Danielle Miller is a 33 y.o. female past medical history significant for asthma, anxiety, depression who presents with concern for cold sweats, headache, back and chest pain starting last night, worsening this morning.  She reports pain worse with cough, nothing seems to make it better.  Not exertional in nature.  She denies any recent travel, hemoptysis, shortness of breath, unilateral leg swelling.  No previous history of cancer.  She does endorse 2 to 3 cigarettes/day tobacco use.   Chest Pain Associated symptoms: back pain   Back Pain Associated symptoms: chest pain        Home Medications Prior to Admission medications   Medication Sig Start Date End Date Taking? Authorizing Provider  acetaminophen (TYLENOL) 500 MG tablet Take 2 tablets (1,000 mg total) by mouth every 6 (six) hours as needed for moderate pain, fever, headache or mild pain. 06/12/21   Fayrene Helper, PA-C  diclofenac (VOLTAREN) 75 MG EC tablet Take 1 tablet (75 mg total) by mouth 2 (two) times daily. 10/30/22   Tarry Kos, MD  docusate sodium (COLACE) 100 MG capsule Take 1 capsule (100 mg total) by mouth 2 (two) times daily as needed for mild constipation. 05/03/20   Shivaji, Valerie Roys, MD  ferrous sulfate 325 (65 FE) MG tablet Take 325 mg by mouth daily with breakfast.    [provider]  HYDROcodone-acetaminophen (NORCO) 5-325 MG tablet Take 1 tablet by mouth 3 (three) times daily as needed. 06/18/21   Cristie Hem, PA-C  ibuprofen (ADVIL) 800 MG tablet Take 1 tablet (800 mg total) by mouth every 8 (eight) hours as needed. 05/03/20   Shivaji, Valerie Roys, MD  lidocaine (XYLOCAINE) 2 % solution Use as directed 15 mLs in the mouth or throat as needed for mouth pain. 06/12/21   Fayrene Helper, PA-C   predniSONE (STERAPRED UNI-PAK 21 TAB) 10 MG (21) TBPK tablet Take as directed 10/30/22   Tarry Kos, MD  Prenatal Vit-Fe Fumarate-FA (PRENATAL MULTIVITAMIN) TABS tablet Take 1 tablet by mouth daily at 12 noon.    [provider]      Allergies    Ibuprofen, Sulfa antibiotics, and Toradol [ketorolac tromethamine]    Review of Systems   Review of Systems  Cardiovascular:  Positive for chest pain.  Musculoskeletal:  Positive for back pain.  All other systems reviewed and are negative.   Physical Exam Updated Vital Signs BP (!) 142/95   Pulse 83   Temp 98 F (36.7 C) (Oral)   Resp 17   Ht 5\' 7"  (1.702 m)   Wt 97.5 kg   LMP 01/09/2024 (Approximate)   SpO2 99%   BMI 33.67 kg/m  Physical Exam Vitals and nursing note reviewed.  Constitutional:      General: She is not in acute distress.    Appearance: Normal appearance.  HENT:     Head: Normocephalic and atraumatic.  Eyes:     General:        Right eye: No discharge.        Left eye: No discharge.  Cardiovascular:     Rate and Rhythm: Normal rate and regular rhythm.     Heart sounds: No murmur heard.    No friction rub. No gallop.  Pulmonary:     Effort: Pulmonary effort is normal.     Breath sounds: Normal breath sounds.     Comments: No wheezing, rhonchi, stridor, rales Abdominal:     General: Bowel sounds are normal.     Palpations: Abdomen is soft.  Skin:    General: Skin is warm and dry.     Capillary Refill: Capillary refill takes less than 2 seconds.  Neurological:     Mental Status: She is alert and oriented to person, place, and time.  Psychiatric:        Mood and Affect: Mood normal.        Behavior: Behavior normal.     ED Results / Procedures / Treatments   Labs (all labs ordered are listed, but only abnormal results are displayed) Labs Reviewed  BASIC METABOLIC PANEL - Abnormal; Notable for the following components:      Result Value   Glucose, Bld 124 (*)    All other components  within normal limits  CBC - Abnormal; Notable for the following components:   MCH 25.2 (*)    All other components within normal limits  RESP PANEL BY RT-PCR (RSV, FLU A&B, COVID)  [id number]  HCG, SERUM, QUALITATIVE  TROPONIN I (HIGH SENSITIVITY)    EKG None  Radiology DG Chest 2 View Result Date: 01/28/2024 CLINICAL DATA:  Chest pain. EXAM: CHEST - 2 VIEW COMPARISON:  08/31/2022. FINDINGS: Bilateral lung fields are clear. Bilateral costophrenic angles are clear. Normal cardio-mediastinal silhouette. No acute osseous abnormalities. The soft tissues are within normal limits. IMPRESSION: No active cardiopulmonary disease. Electronically Signed   By: Jules Schick M.D.   On: 01/28/2024 09:18    Procedures Procedures    Medications Ordered in ED Medications  ketorolac (TORADOL) 15 MG/ML injection 15 mg (15 mg Intravenous Given 01/28/24 0757)  diphenhydrAMINE (BENADRYL) injection 25 mg (25 mg Intravenous Given 01/28/24 0846)    ED Course/ Medical Decision Making/ A&P                                 Medical Decision Making Amount and/or Complexity of Data Reviewed Labs: ordered. Radiology: ordered.  Risk Prescription drug management.   This patient is a 33 y.o. female  who presents to the ED for concern of chest pain, back pain, headache, cough, congestion, all gradual onset over the last 2 days, she notices cold sweats as well..   Differential diagnoses prior to evaluation: The emergent differential diagnosis includes, but is not limited to,  ACS, AAS, PE, Mallory-Weiss, Boerhaave's, Pneumonia, acute bronchitis, asthma or COPD exacerbation, anxiety, MSK pain or traumatic injury to the chest, acid reflux versus other, considered upper respiratory infection including COVID, flu, RSV, versus other viral illness, costochondritis. This is not an exhaustive differential.   Past Medical History / Co-morbidities / Social History: Asthma, anxiety, depression, tobacco use  Physical  Exam: Physical exam performed. The pertinent findings include: Vital signs stable in the ED other than mild diastolic hypertension, blood pressure 130/91, overall normal respiratory rate, no wheezing, rhonchi, rales, no respiratory distress  Lab Tests/Imaging studies: I personally interpreted labs/imaging and the pertinent results include: CBC unremarkable, RVP negative for COVID, flu, RSV, BMP unremarkable, troponin undetectable in context of chest pain ongoing for greater than 12 hours.  Serum pregnancy test negative.  I independently interpreted plain film chest x-ray which shows no evidence of acute intrathoracic abnormality.. I agree with the radiologist interpretation.  Cardiac monitoring: EKG obtained and interpreted by myself and attending physician which shows: nsr, no acute st changes, non-specific anterior t wave abnormality   Medications: I ordered medication including Toradol, patient with hives reaction, given Benadryl with improvement of symptoms.  I have reviewed the patients home medicines and have made adjustments as needed.   Disposition: After consideration of the diagnostic results and the patients response to treatment, I feel that symptoms are consistent with some intercostal irritation/pleurisy related to cough from presumed upper respiratory infection, no evidence of acute ACS, negative by [facility] criteria for PE, symptoms improved with Toradol despite having some hive reaction, but hives improved with Benadryl.  Encouraged supportive care at home, discussed extensive return precautions.Marland Kitchen   emergency department workup does not suggest an emergent condition requiring admission or immediate intervention beyond what has been performed at this time. The plan is: as above. The patient is safe for discharge and has been instructed to return immediately for worsening symptoms, change in symptoms or any other concerns.  Final Clinical Impression(s) / ED Diagnoses Final diagnoses:   Upper respiratory tract infection, unspecified type  Chest wall pain    Rx / DC Orders ED Discharge Orders     None         [place] 01/28/24 1008    K[place], [place], MD 01/28/24 [phone]

## 2024-01-28 NOTE — ED Triage Notes (Signed)
 Patient presented to ER for cold sweats, headache, back and chest pain starting last night. Patient denies being around anyone sick, but states she works at an [employer]. Ambulatory to room.

## 2024-01-28 NOTE — Discharge Instructions (Signed)
 Please use Tylenol for pain.  You may use 1000 mg of Tylenol every 6 hours.  Not to exceed 4 g of Tylenol within 24 hours.  He can use naproxen in addition to the above as needed up to every 6 hours.  You can use over-the-counter cough and cold medication as needed for symptomatic relief. Please drink plenty of fluids including electrolyte containing fluids, such as pedialyte, gatorade.

## 2024-01-28 NOTE — ED Notes (Signed)
 Patient started having hives and itching provider aware.  See new orders

## 2024-03-13 IMAGING — CR DG CHEST 2V
2 series · 2 of 2 positions shown · non-contrast
Comparison: Chest x-ray 01/14/2018.

CLINICAL DATA: 30-year-old female with history of productive cough.
Light green sputum. Chest pain and back pain.

EXAM:
CHEST - 2 VIEW

[w chest pa]
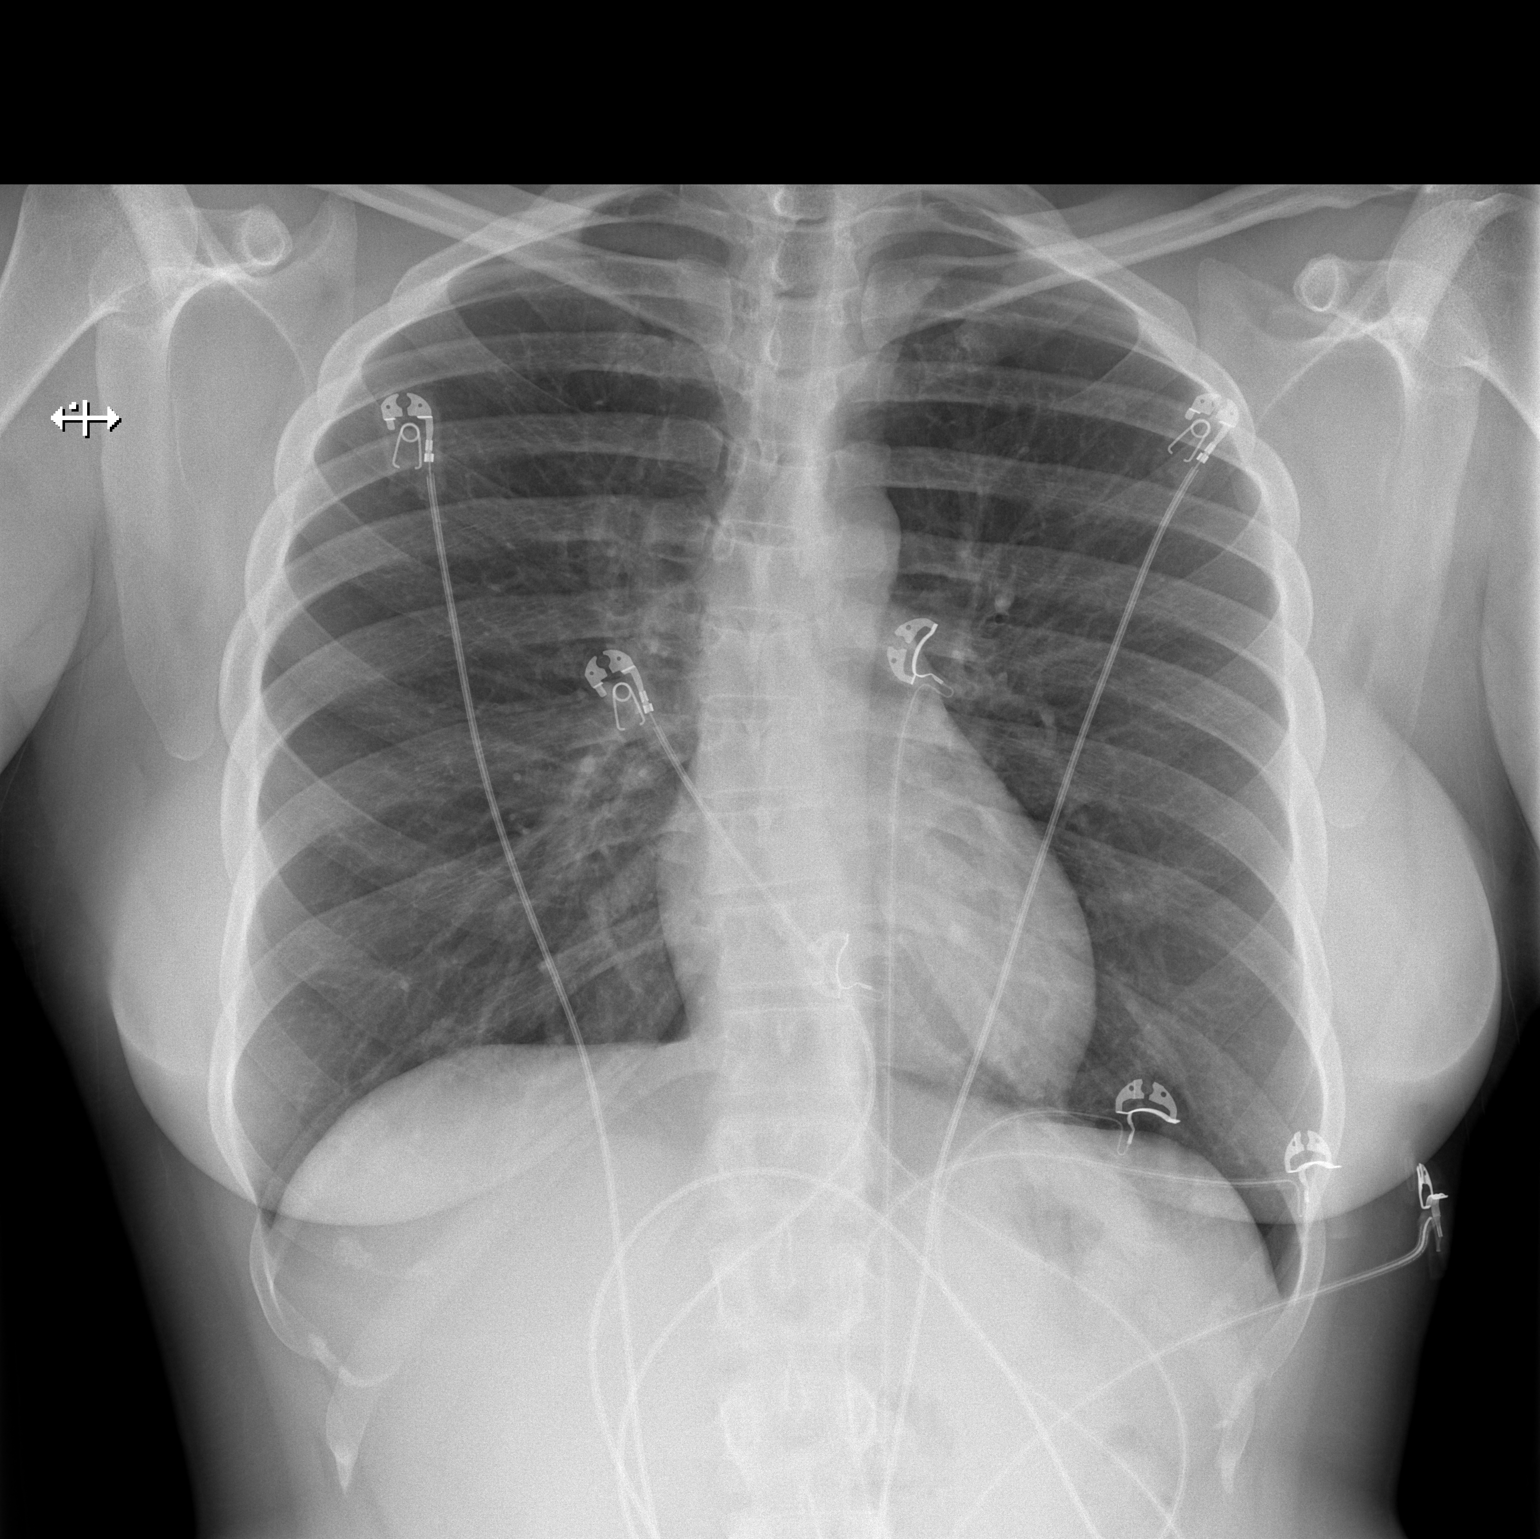

[w chest lat]
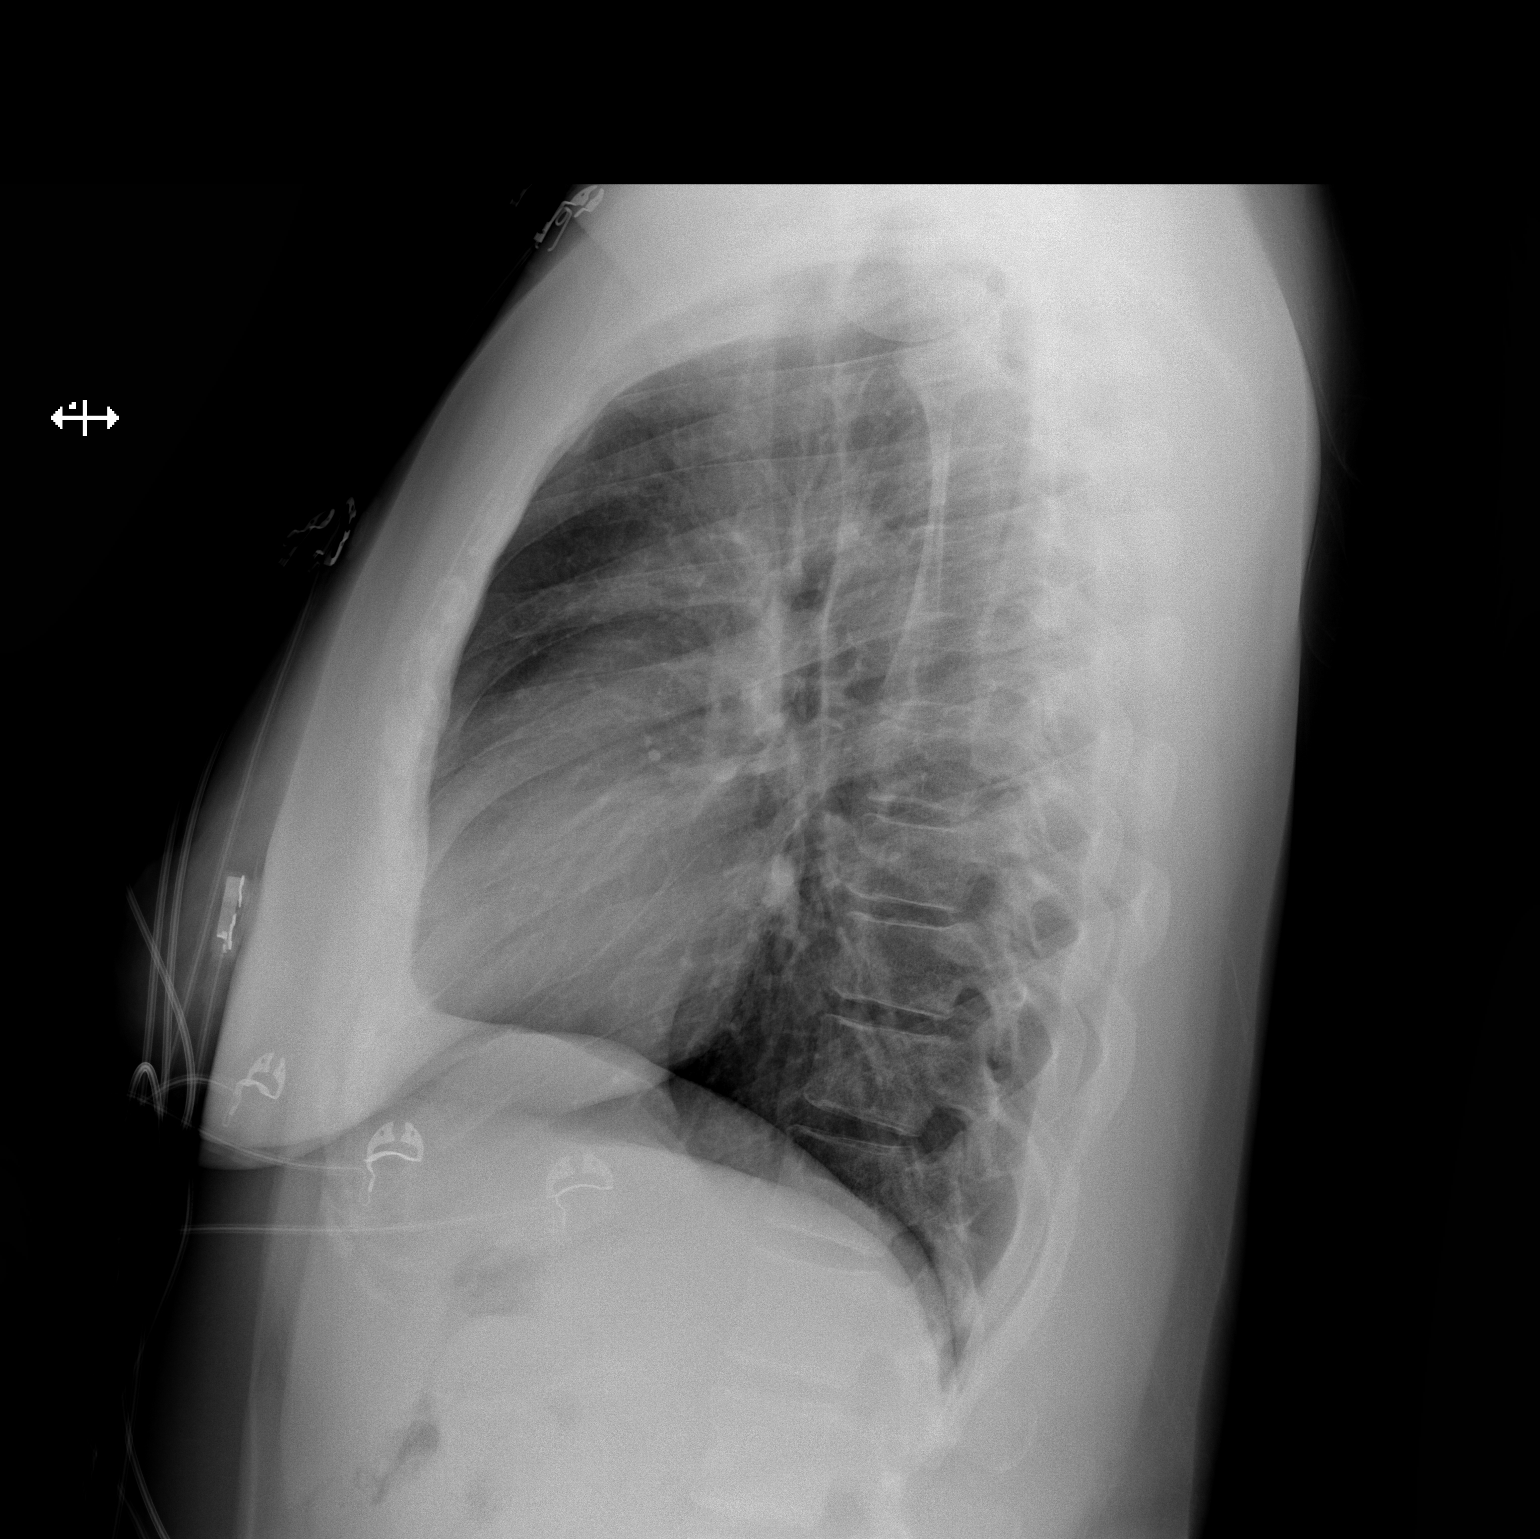

[2 of 2 positions shown; findings below may reference images not displayed]

FINDINGS: Lung volumes are normal. No consolidative airspace disease. No
pleural effusions. No pneumothorax. No pulmonary nodule or mass
noted. Pulmonary vasculature and the cardiomediastinal silhouette
are within normal limits.
IMPRESSION: No radiographic evidence of acute cardiopulmonary disease.
